# Patient Record
Sex: Male | Born: 1955
Health system: Southern US, Community
[De-identification: ages and names within clinical notes are randomized; demographics above are authoritative.]

## PROBLEM LIST (undated history)

## (undated) DIAGNOSIS — I10 Essential (primary) hypertension: Secondary | ICD-10-CM

---

## 2001-01-16 ENCOUNTER — Ambulatory Visit (HOSPITAL_COMMUNITY): Admission: RE | Admit: 2001-01-16 | Discharge: 2001-01-16 | Payer: Self-pay | Admitting: Family Medicine

## 2001-01-16 ENCOUNTER — Encounter: Payer: Self-pay | Admitting: Family Medicine

## 2001-02-12 ENCOUNTER — Emergency Department (HOSPITAL_COMMUNITY): Admission: EM | Admit: 2001-02-12 | Discharge: 2001-02-12 | Payer: Self-pay | Admitting: *Deleted

## 2001-02-12 ENCOUNTER — Encounter: Payer: Self-pay | Admitting: *Deleted

## 2002-02-08 ENCOUNTER — Emergency Department (HOSPITAL_COMMUNITY): Admission: EM | Admit: 2002-02-08 | Discharge: 2002-02-08 | Payer: Self-pay | Admitting: *Deleted

## 2005-01-27 ENCOUNTER — Emergency Department (HOSPITAL_COMMUNITY): Admission: EM | Admit: 2005-01-27 | Discharge: 2005-01-27 | Payer: Self-pay | Admitting: Emergency Medicine

## 2007-06-06 ENCOUNTER — Observation Stay (HOSPITAL_COMMUNITY): Admission: EM | Admit: 2007-06-06 | Discharge: 2007-06-06 | Payer: Self-pay | Admitting: Cardiology

## 2008-04-29 ENCOUNTER — Emergency Department (HOSPITAL_COMMUNITY): Admission: EM | Admit: 2008-04-29 | Discharge: 2008-04-29 | Payer: Self-pay | Admitting: Emergency Medicine

## 2009-09-26 ENCOUNTER — Emergency Department (HOSPITAL_COMMUNITY): Admission: EM | Admit: 2009-09-26 | Discharge: 2009-09-26 | Payer: Self-pay | Admitting: Emergency Medicine

## 2009-12-12 IMAGING — CT CT PELVIS W/ CM
2 of 5 series · 16 of 46 positions shown, 18 images · IV contrast (Omnipaque 300)
Comparison: No comparison CT.  Prior plain film examination
04/29/2008.

CT ABDOMEN

CLINICAL DATA: Right lower quadrant pain.

CT ABDOMEN AND PELVIS WITH CONTRAST
TECHNIQUE: Multidetector CT imaging of the abdomen and pelvis was
performed using the standard protocol following bolus
administration of intravenous contrast.
Contrast: 100 ml 2mnipaque-QRR.

[Series 2: abd_pel 5.0 b40f · axial · 0.76mm/px · z∈[-430,+10]mm · 13 of 100 slices shown, 15 images]
[im 6/100  soft-tissue]
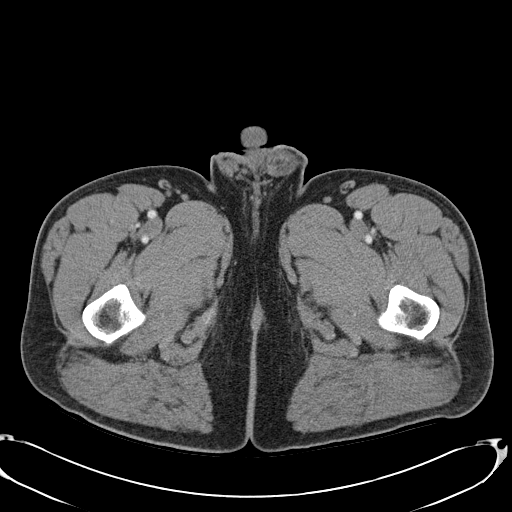
[im 6/100  bone]
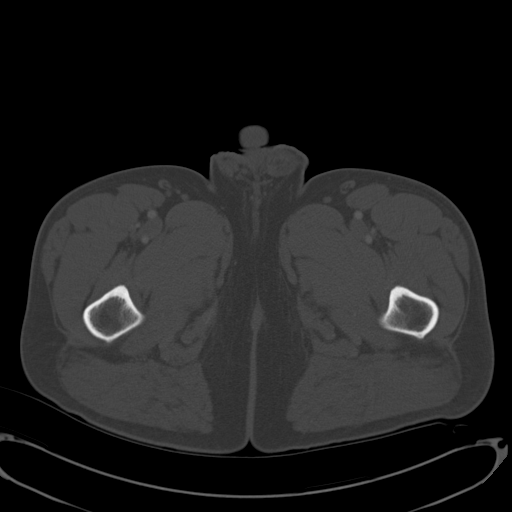
[im 12/100  soft-tissue]
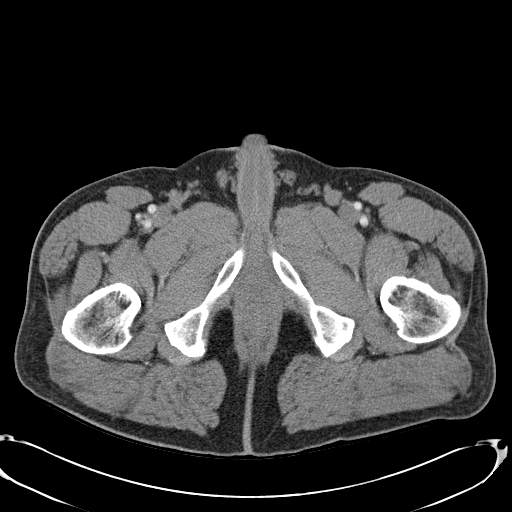
[im 24/100  soft-tissue]
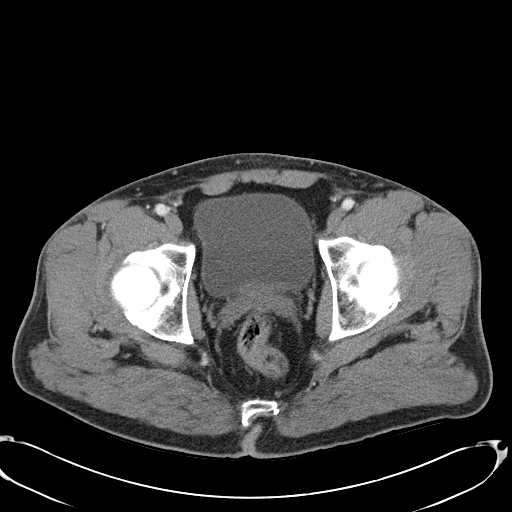
[im 30/100  soft-tissue]
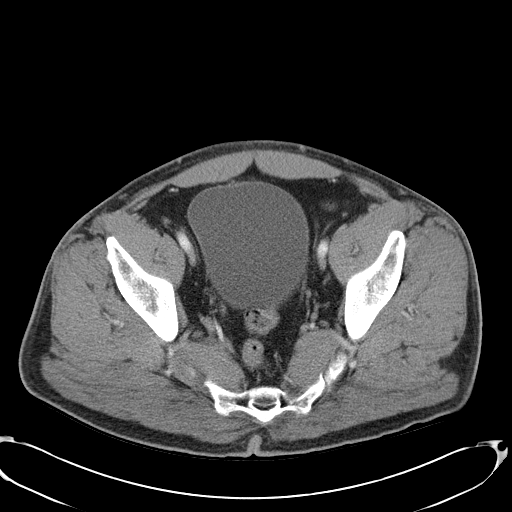
[im 35/100  soft-tissue]
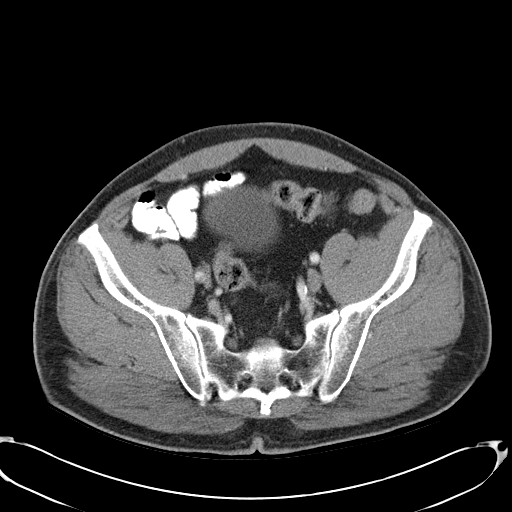
[im 41/100  soft-tissue]
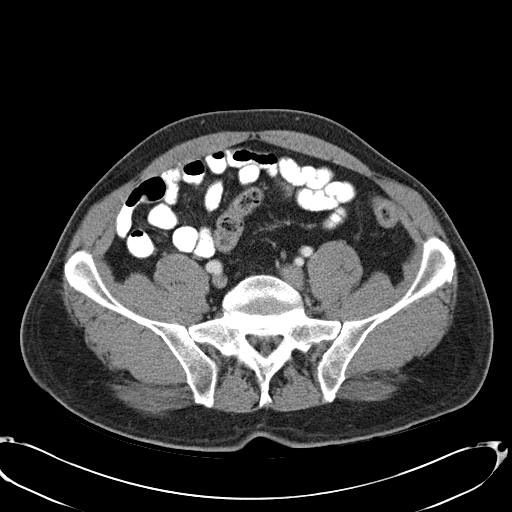
[im 53/100  soft-tissue]
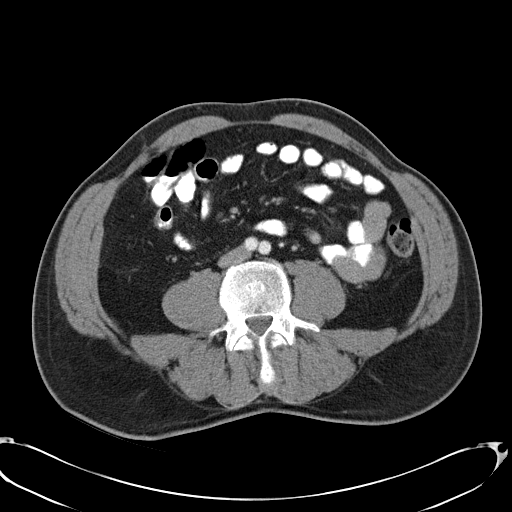
[im 59/100  soft-tissue]
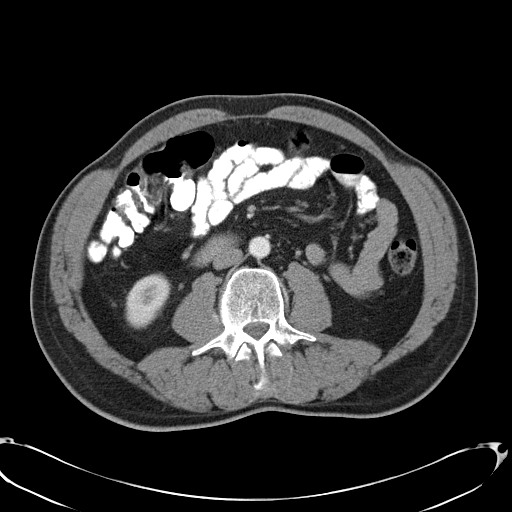
[im 65/100  soft-tissue]
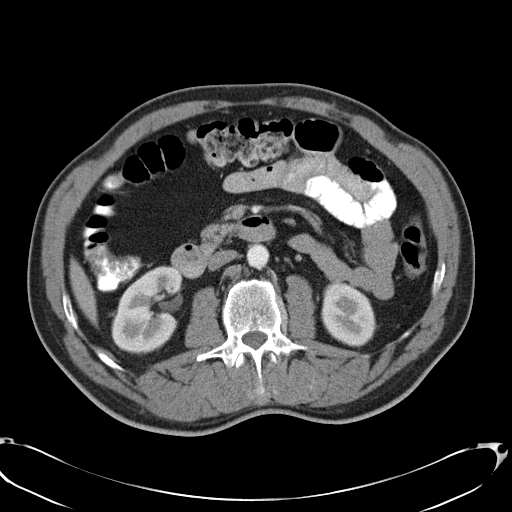
[im 65/100  bone]
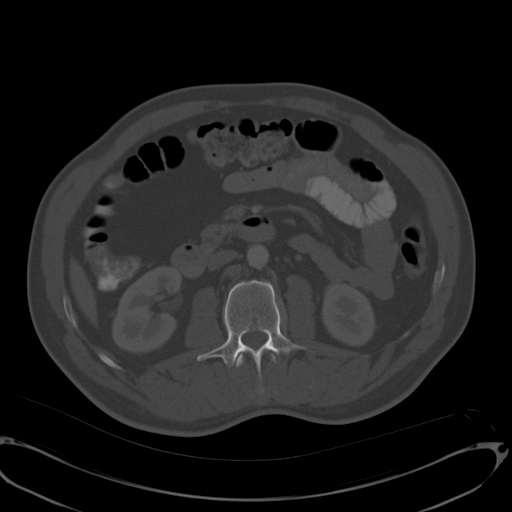
[im 70/100  soft-tissue]
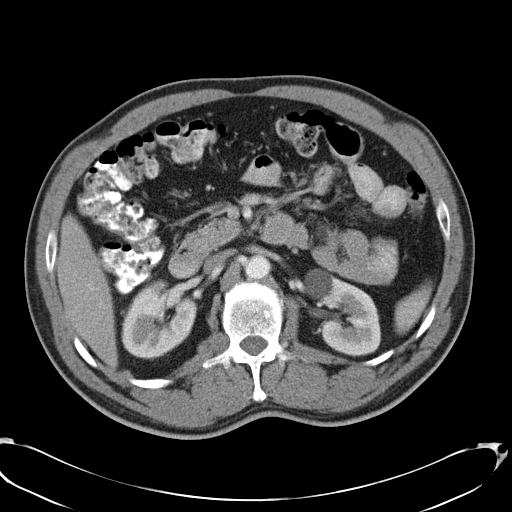
[im 76/100  soft-tissue]
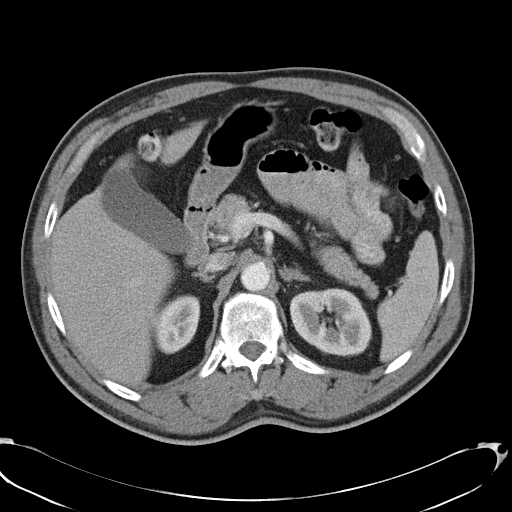
[im 88/100  soft-tissue]
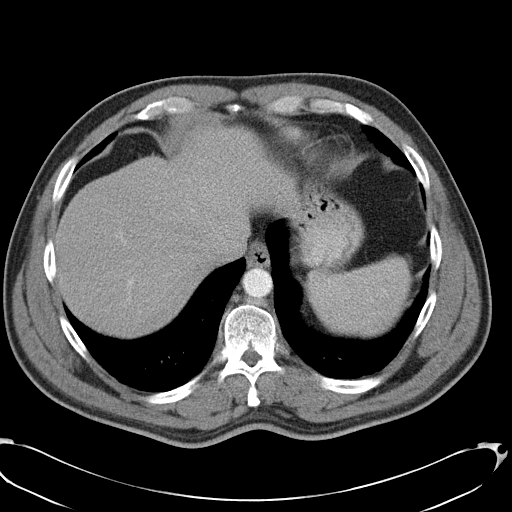
[im 94/100  soft-tissue]
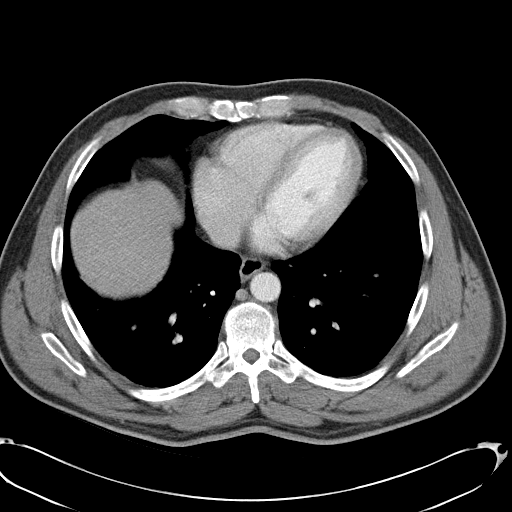

[Series 4: mpr coro post contrast · coronal · 0.73mm/px · 3 of 83 slices shown]
[im 28/83  soft-tissue]
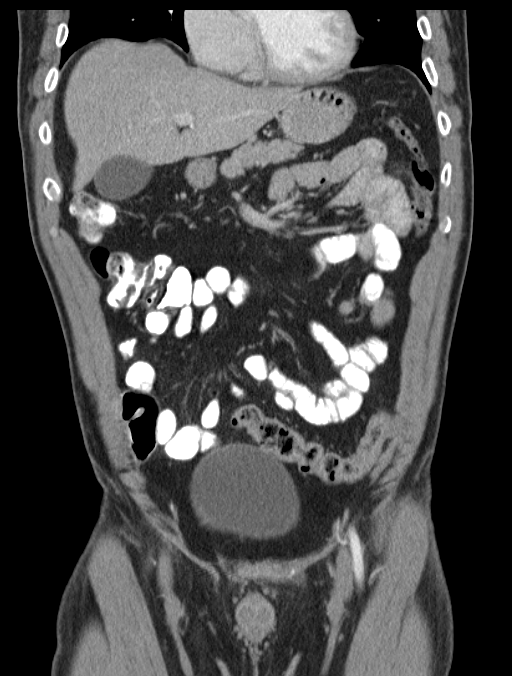
[im 37/83  soft-tissue]
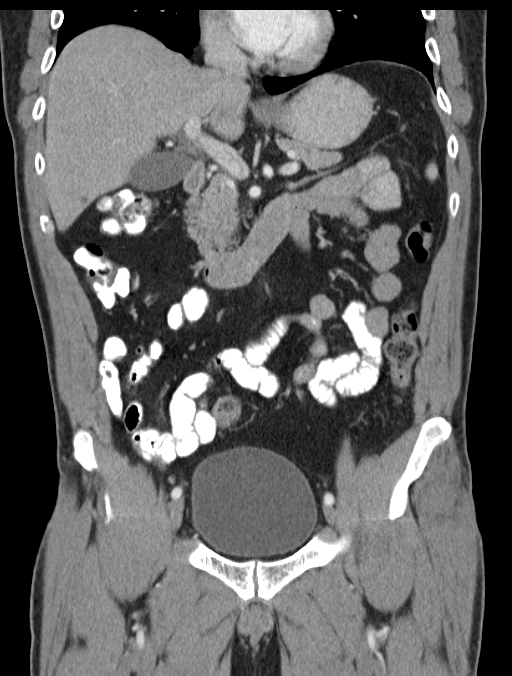
[im 46/83  soft-tissue]
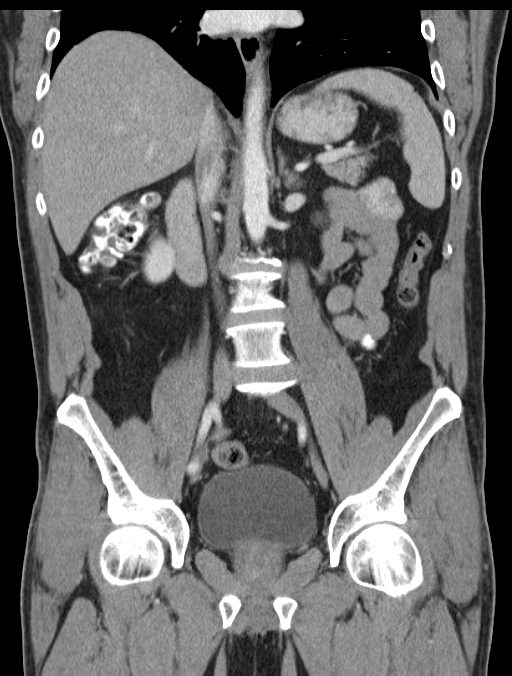

[16 of 46 positions shown; findings below may reference images not displayed]

FINDINGS: Scarring/subsegmental atelectatic changes lung bases.
Three sub centimeter low density lesions within the liver too small
to adequately characterize.  Suggestion of mild fatty infiltration
liver.  No calcified gallstone.  Left renal 2.4 cm cyst otherwise
no focal renal lesion.  Adrenal glands, pancreas and spleen
unremarkable.  Very mild calcification of the abdominal aorta
without aneurysmal dilation.  No upper abdominal abnormal fluid
collection or inflammatory process.  No retroperitoneal or pelvic
adenopathy.  Question small hiatal hernia. Degenerative changes
thoracic and lumbar spine. No bony destructive lesion.
IMPRESSION: No upper abdominal abnormal fluid collection inflammatory process.

Mild fatty infiltration liver with three sub centimeter low density
lesions too small to adequately characterize.

2.4 cm left renal cyst.

CT PELVIS
FINDINGS: No pelvic inflammatory process.  Specifically, no
inflammation surrounds the appendix. Generative changes lower
lumbar spine. No inguinal hernia.  Prostate gland top normal in
size.
IMPRESSION: No pelvic inflammatory process.

## 2010-06-26 LAB — BASIC METABOLIC PANEL
CO2: 29 mEq/L (ref 19–32)
Calcium: 9 mg/dL (ref 8.4–10.5)
Creatinine, Ser: 1.12 mg/dL (ref 0.4–1.5)
Glucose, Bld: 104 mg/dL — ABNORMAL HIGH (ref 70–99)

## 2010-06-26 LAB — URINALYSIS, ROUTINE W REFLEX MICROSCOPIC
Nitrite: NEGATIVE
Specific Gravity, Urine: 1.025 (ref 1.005–1.030)
pH: 6 (ref 5.0–8.0)

## 2010-06-26 LAB — CBC
Hemoglobin: 14.3 g/dL (ref 13.0–17.0)
MCHC: 34.6 g/dL (ref 30.0–36.0)
RDW: 12.7 % (ref 11.5–15.5)

## 2010-06-26 LAB — DIFFERENTIAL
Basophils Absolute: 0 10*3/uL (ref 0.0–0.1)
Basophils Relative: 0 % (ref 0–1)
Monocytes Absolute: 0.6 10*3/uL (ref 0.1–1.0)
Neutro Abs: 3.8 10*3/uL (ref 1.7–7.7)
Neutrophils Relative %: 58 % (ref 43–77)

## 2010-06-26 LAB — URINE CULTURE: Colony Count: NO GROWTH

## 2010-07-25 LAB — COMPREHENSIVE METABOLIC PANEL
ALT: 14 U/L (ref 0–53)
AST: 16 U/L (ref 0–37)
Albumin: 4 g/dL (ref 3.5–5.2)
CO2: 29 mEq/L (ref 19–32)
Calcium: 9.1 mg/dL (ref 8.4–10.5)
GFR calc Af Amer: >60 mL/min (ref >60)
Sodium: 137 mEq/L (ref 135–145)
Total Protein: 6.7 g/dL (ref 6.0–8.3)

## 2010-07-25 LAB — DIFFERENTIAL
Eosinophils Absolute: 0.1 10*3/uL (ref 0.0–0.7)
Eosinophils Relative: 2 % (ref 0–5)
Lymphocytes Relative: 30 % (ref 12–46)
Lymphs Abs: 1.8 10*3/uL (ref 0.7–4.0)
Monocytes Relative: 9 % (ref 3–12)

## 2010-07-25 LAB — CBC
MCHC: 33.8 g/dL (ref 30.0–36.0)
Platelets: 203 10*3/uL (ref 150–400)
RBC: 5.28 MIL/uL (ref 4.22–5.81)
RDW: 12.7 % (ref 11.5–15.5)

## 2010-07-25 LAB — URINALYSIS, ROUTINE W REFLEX MICROSCOPIC
Bilirubin Urine: NEGATIVE
Nitrite: NEGATIVE
Protein, ur: NEGATIVE mg/dL
Specific Gravity, Urine: 1.02 (ref 1.005–1.030)
Urobilinogen, UA: 0.2 mg/dL (ref 0.0–1.0)

## 2010-08-23 NOTE — Discharge Summary (Signed)
NAMECEDERICK, BROADNAX NO.:  [id number]   MEDICAL RECORD NO.:  [id number]           PATIENT TYPE:   LOCATION:                                 FACILITY:   PHYSICIAN:  Vonna Kotyk R. Jacinto Halim, MD       DATE OF BIRTH:  [birth year]   DATE OF ADMISSION:  06/06/2007  DATE OF DISCHARGE:  06/07/2007                               DISCHARGE SUMMARY   HISTORY:  Mr. Dupras is a 55 year old white male patient who was  transferred from [facility] with complaints of chest pain, it  started while he was at rest. He reported sharp, stabbing, substernal  chest pain, denied association with shortness of breath or diaphoresis.  He did have some mild nausea with it.  He denied any exertional chest  pain or any notable dyspnea on exertion.  He was given a GI cocktail and  sublingual nitroglycerin, and Dilaudid with relief of his pain.  He was  transferred to [facility].   PRIOR MEDICAL HISTORY INCLUDES:  Hypertension, tobacco use, premature  family history of coronary artery disease.  He smokes a half-pack per  day.  He did not know the name of his medications, but he had been out  of them for 1 day.   ALLERGIES:  NKDA.   HOSPITAL COURSE:  He was admitted.  He was put on Lovenox.  He was seen  by Dr. Yates Decamp, the following day, on June 06, 2007.  His CK/MBs  were negative x2.  His total cholesterol 188, triglycerides 100, HDL 36,  LDL was 132.  He had normal renal function.  His AST 15, ALT 17, BUN 13,  creatinine 1.0, glucose was 103.  Sodium 137, potassium was 3.6.  Hemoglobin 14.3, hematocrit 41.5, WBC 6.8, and platelets were 192.  It  was decided that he could be discharged home in stable condition for  outpatient evaluation.   DISCHARGE MEDICATIONS:  1. Prilosec 20 mg a day.  2. Pravachol 40 mg a day.  3. Aspirin 81 mg a day.  4. Metoprolol 25 mg 1/2 twice per day.   DISCHARGE INSTRUCTIONS:  He will have a treadmill Myoview and a 2-D echo  on June 12, 2007.   He will follow up with Dr. Domingo Sep in [place]  office on June 14, 2007.   DISCHARGE DIAGNOSIS:  1. Chest pain, atypical, CK/MB is negative.  2. Dyslipidemia with low HDL and high LDL.  3. Tobacco smoking.  4. 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  5. Hypertension.      Lezlie Octave, N.P.      Cristy Hilts. Jacinto Halim, MD  Electronically Signed    BB/MEDQ  D:  06/06/2007  T:  06/07/2007  Job:  [phone]

## 2010-12-30 LAB — COMPREHENSIVE METABOLIC PANEL
AST: 15
Albumin: 3.5
BUN: 13
Calcium: 8.7
Creatinine, Ser: 1
GFR calc Af Amer: >60
Total Bilirubin: 0.7
Total Protein: 5.8 — ABNORMAL LOW

## 2010-12-30 LAB — CARDIAC PANEL(CRET KIN+CKTOT+MB+TROPI)
Total CK: 121
Troponin I: 0.03

## 2010-12-30 LAB — CBC
HCT: 41.5
Hemoglobin: 15.9
MCHC: 34.4
MCHC: 35
MCV: 83.9
MCV: 85.1
Platelets: 192
RBC: 5.41
RDW: 12.9
WBC: 6.8
WBC: 8.8

## 2010-12-30 LAB — DIFFERENTIAL
Basophils Absolute: 0
Basophils Relative: 1
Eosinophils Absolute: 0.2
Eosinophils Relative: 2
Lymphocytes Relative: 39
Lymphs Abs: 2.7
Lymphs Abs: 3.6
Monocytes Absolute: 0.6
Monocytes Absolute: 0.8
Monocytes Relative: 10
Monocytes Relative: 9
Neutro Abs: 3.4
Neutro Abs: 4.2
Neutrophils Relative %: 48

## 2010-12-30 LAB — BASIC METABOLIC PANEL
CO2: 30
Calcium: 9.5
Chloride: 102
GFR calc Af Amer: >60
Sodium: 139

## 2010-12-30 LAB — TSH: TSH: 9.435 — ABNORMAL HIGH

## 2010-12-30 LAB — LIPID PANEL
HDL: 36 — ABNORMAL LOW
Total CHOL/HDL Ratio: 5.2

## 2010-12-30 LAB — MAGNESIUM: Magnesium: 2.1

## 2010-12-30 LAB — POCT CARDIAC MARKERS
Myoglobin, poc: 88.3
Operator id: 216221
Troponin i, poc: <0.05
Troponin i, poc: <0.05

## 2010-12-30 LAB — PROTIME-INR
INR: 1.1
Prothrombin Time: 14.4

## 2011-05-07 ENCOUNTER — Emergency Department (HOSPITAL_COMMUNITY)
Admission: EM | Admit: 2011-05-07 | Discharge: 2011-05-08 | Disposition: A | Discharge status: Home / Self Care | Payer: Self-pay | Attending: Emergency Medicine | Admitting: Emergency Medicine

## 2011-05-07 ENCOUNTER — Encounter (HOSPITAL_COMMUNITY): Payer: Self-pay | Admitting: Emergency Medicine

## 2011-05-07 DIAGNOSIS — R0789 Other chest pain: Secondary | ICD-10-CM

## 2011-05-07 DIAGNOSIS — T148XXA Other injury of unspecified body region, initial encounter: Secondary | ICD-10-CM

## 2011-05-07 DIAGNOSIS — I10 Essential (primary) hypertension: Secondary | ICD-10-CM | POA: Insufficient documentation

## 2011-05-07 DIAGNOSIS — M549 Dorsalgia, unspecified: Secondary | ICD-10-CM | POA: Insufficient documentation

## 2011-05-07 DIAGNOSIS — J40 Bronchitis, not specified as acute or chronic: Secondary | ICD-10-CM | POA: Insufficient documentation

## 2011-05-07 DIAGNOSIS — F17200 Nicotine dependence, unspecified, uncomplicated: Secondary | ICD-10-CM | POA: Insufficient documentation

## 2011-05-07 HISTORY — DX: Essential (primary) hypertension: I10

## 2011-05-07 LAB — CBC
Hemoglobin: 15.9 g/dL (ref 13.0–17.0)
MCH: 29.9 pg (ref 26.0–34.0)
RBC: 5.31 MIL/uL (ref 4.22–5.81)

## 2011-05-07 LAB — DIFFERENTIAL
Eosinophils Absolute: 0.1 10*3/uL (ref 0.0–0.7)
Lymphs Abs: 2.5 10*3/uL (ref 0.7–4.0)
Monocytes Relative: 10 % (ref 3–12)
Neutro Abs: 5.1 10*3/uL (ref 1.7–7.7)
Neutrophils Relative %: 59 % (ref 43–77)

## 2011-05-07 MED ORDER — SODIUM CHLORIDE 0.9 % IV SOLN
INTRAVENOUS | Status: DC
  Administered 2011-05-07: via INTRAVENOUS

## 2011-05-07 NOTE — ED Notes (Signed)
Patient complaining of upper left back pain. States it started 2 weeks ago in the right upper side of back then went away, and came back on upper left side of back. Denies injury.

## 2011-05-08 ENCOUNTER — Emergency Department (HOSPITAL_COMMUNITY): Payer: Self-pay

## 2011-05-08 LAB — BASIC METABOLIC PANEL
Chloride: 98 mEq/L (ref 96–112)
GFR calc non Af Amer: 70 mL/min — ABNORMAL LOW (ref >90)
Glucose, Bld: 102 mg/dL — ABNORMAL HIGH (ref 70–99)
Potassium: 3.5 mEq/L (ref 3.5–5.1)
Sodium: 134 mEq/L — ABNORMAL LOW (ref 135–145)

## 2011-05-08 LAB — TROPONIN I: Troponin I: <0.3 ng/mL (ref <0.30)

## 2011-05-08 MED ORDER — PROMETHAZINE-CODEINE 6.25-10 MG/5ML PO SYRP: 5.0000 mL | ORAL_SOLUTION | Freq: Four times a day (QID) | ORAL | Status: AC | PRN

## 2011-05-08 MED ORDER — METHOCARBAMOL 500 MG PO TABS: ORAL_TABLET | ORAL | Status: DC

## 2011-05-08 NOTE — ED Provider Notes (Signed)
History     CSN: [id number]  Arrival date & time 05/07/11  2059   First MD Initiated Contact with Patient 05/07/11 2300      Chief Complaint  Patient presents with  . Back Pain    (Consider location/radiation/quality/duration/timing/severity/associated sxs/prior treatment) HPI Comments: Pt states that 2 weeks ago he has pain in the right upper side of the back with cough. This went away and came back nearly a week later on the left back pain. Tonight he noted a sharp pain of the left upper back that has mostly gone away. No c/o SOB, no syncope, No sweats, No recent injury.  He denies hemoptosis or high fever. Risk factors include smoking and hypertension. No reported hx of cardiac problem. No long trips, or times of excessive sedimentary life style.  Patient is a 56 y.o. male presenting with back pain. The history is provided by the patient.  Back Pain  Pertinent negatives include no chest pain, no abdominal pain and no dysuria.    Past Medical History  Diagnosis Date  . Hypertension     History reviewed. No pertinent past surgical history.  History reviewed. No pertinent family history.  History  Substance Use Topics  . Smoking status: Current Some Day Smoker  . Smokeless tobacco: Not on file  . Alcohol Use: No      Review of Systems  Constitutional: Negative for activity change.       All ROS Neg except as noted in HPI  HENT: Positive for congestion and postnasal drip. Negative for nosebleeds and neck pain.   Eyes: Negative for photophobia and discharge.  Respiratory: Positive for cough. Negative for shortness of breath and wheezing.   Cardiovascular: Negative for chest pain and palpitations.  Gastrointestinal: Negative for abdominal pain and blood in stool.  Genitourinary: Negative for dysuria, frequency and hematuria.  Musculoskeletal: Positive for back pain. Negative for arthralgias.  Skin: Negative.   Neurological: Negative for dizziness, seizures and speech  difficulty.  Psychiatric/Behavioral: Negative for hallucinations and confusion.    Allergies  Review of patient's allergies indicates no known allergies.  Home Medications   Current Outpatient Rx  Name Route Sig Dispense Refill  . PRESCRIPTION MEDICATION Oral Take 1 tablet by mouth daily. Patient takes for blood pressure unsure of name and strength..      BP 133/63  Pulse 60  Temp(Src) 98 F (36.7 C) (Oral)  Resp 16  Ht 5\' 11"  (1.803 m)  Wt 190 lb (86.183 kg)  BMI 26.50 kg/m2  SpO2 95%  Physical Exam  Nursing note and vitals reviewed. Constitutional: He is oriented to person, place, and time. He appears well-developed and well-nourished.  Non-toxic appearance.  HENT:  Head: Normocephalic.  Right Ear: Tympanic membrane and external ear normal.  Left Ear: Tympanic membrane and external ear normal.  Eyes: EOM and lids are normal. Pupils are equal, round, and reactive to light.  Neck: Normal range of motion. Neck supple. Carotid bruit is not present.  Cardiovascular: Normal rate, regular rhythm, normal heart sounds, intact distal pulses and normal pulses.   Pulmonary/Chest: Breath sounds normal. No respiratory distress.       Course breath sounds.Minimal pain with ROM of the left shoulder and palpation under the left scapula area.  Abdominal: Soft. Bowel sounds are normal. There is no tenderness. There is no guarding.  Musculoskeletal: Normal range of motion.       Pain and tightness to the left upper back. Left upper back tenderness with attempted range  of motion of the left shoulder. No palpable rib deformity. No abscess appreciated.  Lymphadenopathy:       Head (right side): No submandibular adenopathy present.       Head (left side): No submandibular adenopathy present.    He has no cervical adenopathy.  Neurological: He is alert and oriented to person, place, and time. He has normal strength. No cranial nerve deficit or sensory deficit.  Skin: Skin is warm and dry.    Psychiatric: He has a normal mood and affect. His speech is normal.    ED Course  Procedures (including critical care time)  Labs Reviewed  BASIC METABOLIC PANEL - Abnormal; Notable for the following:    Sodium 134 (*)    Glucose, Bld 102 (*)    GFR calc non Af Amer 70 (*)    GFR calc Af Amer 82 (*)    All other components within normal limits  CBC  DIFFERENTIAL  D-DIMER, QUANTITATIVE  TROPONIN I   Dg Chest 2 View  05/08/2011  *RADIOLOGY REPORT*  Clinical Data: Right upper chest pain, cough, former smoker  CHEST - 2 VIEW  Comparison: 06/05/2007  Findings: Normal heart size, mediastinal contours, and pulmonary vascularity. Minimal atelectasis right base. Peribronchial thickening. No acute infiltrate, pleural effusion or pneumothorax. No acute osseous findings.  IMPRESSION: Bronchitic changes with right basilar atelectasis.  Original Report Authenticated By: Lollie Marrow, M.D.  EKG:  23:37 Rate 57. Rhythm: Sinus Huston Foley. Axis - normal. PR - Normal. QRS - Normal. ST - Normal. No STEMI or life threatening arrhythmia. Rate slower than 06/05/2007, o/w unchanged.   Dx: 1. Upper back pain (left) 2. Bronchitis   MDM  I have reviewed nursing notes, vital signs, and all appropriate lab and imaging results for this patient. This patient had pain on the right upper back approximately 2 weeks ago this went away and came back on the left upper side of the back. The patient noted a increased sharp pain today. He presents to the emergency department for evaluation of this particular problem. His vital signs are stable. Is on pulse oximetry is well within normal limits. His chest x-ray shows bronchitic changes but no acute process problem. His electrocardiogram shows a normal sinus rhythm without life-threatening arrhythmias. His cardiac enzymes, and D-dimer are negative for acute findings. It is safe for this patient to be discharged home. He will be treated for musculoskeletal upper left back and  shoulder he. Issue problem. Prescription for Robaxin 500 mg given to the patient. The patient also complains of cough and congestion and a prescription for promethazine cough medication was given.       Kathie Dike, [place] 05/09/11 [phone]

## 2011-05-09 NOTE — ED Provider Notes (Signed)
Medical screening examination/treatment/procedure(s) were performed by non-physician practitioner and as supervising physician I was immediately available for consultation/collaboration.  Nicoletta Dress. Colon Branch, MD 05/09/11 [phone]

## 2013-01-27 ENCOUNTER — Emergency Department (HOSPITAL_COMMUNITY)
Admission: EM | Admit: 2013-01-27 | Discharge: 2013-01-27 | Disposition: A | Discharge status: Home / Self Care | Payer: Self-pay | Attending: Emergency Medicine | Admitting: Emergency Medicine

## 2013-01-27 ENCOUNTER — Emergency Department (HOSPITAL_COMMUNITY): Payer: Self-pay

## 2013-01-27 ENCOUNTER — Encounter (HOSPITAL_COMMUNITY): Payer: Self-pay | Admitting: Emergency Medicine

## 2013-01-27 DIAGNOSIS — Y9289 Other specified places as the place of occurrence of the external cause: Secondary | ICD-10-CM | POA: Insufficient documentation

## 2013-01-27 DIAGNOSIS — F17200 Nicotine dependence, unspecified, uncomplicated: Secondary | ICD-10-CM | POA: Insufficient documentation

## 2013-01-27 DIAGNOSIS — Y9389 Activity, other specified: Secondary | ICD-10-CM | POA: Insufficient documentation

## 2013-01-27 DIAGNOSIS — I10 Essential (primary) hypertension: Secondary | ICD-10-CM | POA: Insufficient documentation

## 2013-01-27 DIAGNOSIS — W208XXA Other cause of strike by thrown, projected or falling object, initial encounter: Secondary | ICD-10-CM | POA: Insufficient documentation

## 2013-01-27 DIAGNOSIS — S90129A Contusion of unspecified lesser toe(s) without damage to nail, initial encounter: Secondary | ICD-10-CM | POA: Insufficient documentation

## 2013-01-27 DIAGNOSIS — S90111A Contusion of right great toe without damage to nail, initial encounter: Secondary | ICD-10-CM

## 2013-01-27 DIAGNOSIS — Z79899 Other long term (current) drug therapy: Secondary | ICD-10-CM | POA: Insufficient documentation

## 2013-01-27 MED ORDER — IBUPROFEN 800 MG PO TABS
800.0000 mg | ORAL_TABLET | Freq: Once | ORAL | Status: AC
  Administered 2013-01-27: 800 mg via ORAL
  Filled 2013-01-27: qty 1

## 2013-01-27 NOTE — ED Provider Notes (Signed)
CSN: [id number]     Arrival date & time 01/27/13  2133 History   First MD Initiated Contact with Patient 01/27/13 2228     Chief Complaint  Patient presents with  . Toe Injury   (Consider location/radiation/quality/duration/timing/severity/associated sxs/prior Treatment) Patient is a 57 y.o. male presenting with toe pain. The history is provided by the patient.  Toe Pain This is a new problem. The current episode started today. The problem has been gradually worsening.   Francisco Nash is a 57 y.o. male who presents to the ED with pain in the great toe of right foot. He dropped a heavy cinder block on his foot tonight. He has bruising and swelling of the right great toe. He denies any other injuries.  Past Medical History  Diagnosis Date  . Hypertension    History reviewed. No pertinent past surgical history. No family history on file. History  Substance Use Topics  . Smoking status: Current Some Day Smoker  . Smokeless tobacco: Not on file  . Alcohol Use: No    Review of Systems  Musculoskeletal:       Right great toe pain   10 systems reviewed and negative.   Allergies  Review of patient's allergies indicates no known allergies.  Home Medications   Current Outpatient Rx  Name  Route  Sig  Dispense  Refill  . methocarbamol (ROBAXIN) 500 MG tablet      2 po tid for spasm   30 tablet   0   . PRESCRIPTION MEDICATION   Oral   Take 1 tablet by mouth daily. Patient takes for blood pressure unsure of name and strength..          BP 152/109  Pulse 92  Temp(Src) 98.4 F (36.9 C) (Oral)  Resp 18  Ht 5\' 11"  (1.803 m)  Wt 190 lb (86.183 kg)  BMI 26.51 kg/m2  SpO2 96% Physical Exam  Nursing note and vitals reviewed. Constitutional: He is oriented to person, place, and time. He appears well-developed and well-nourished. No distress.  HENT:  Head: Normocephalic and atraumatic.  Eyes: EOM are normal.  Neck: Neck supple.  Cardiovascular: Normal rate.     Pulmonary/Chest: Effort normal.  Abdominal: Soft. There is no tenderness.  Musculoskeletal:       Right foot: He exhibits tenderness and swelling. He exhibits normal range of motion.       Feet:  Tenderness swelling and ecchymosis at base of right great toe. Nail without injury. Pedal pulse strong, adequate circulation, good touch sensation.  Neurological: He is alert and oriented to person, place, and time. No cranial nerve deficit.  Skin: Skin is warm and dry.  Psychiatric: He has a normal mood and affect. His behavior is normal.    ED Course  ProceduresDg Foot Complete Right  01/27/2013   CLINICAL DATA:  Pain, swelling, and bruising of the right 1st toe to after patient dropped a cement block on it.  EXAM: RIGHT FOOT COMPLETE - 3+ VIEW  COMPARISON:  None.  FINDINGS: There is no evidence of fracture or dislocation. There is no evidence of arthropathy or other focal bone abnormality. Soft tissues are unremarkable.  IMPRESSION: Negative.   Electronically Signed   By: Burman Nieves M.D.   On: 01/27/2013 22:02    MDM  57 y.o. male with contusion to the right foot. Buddy tape and offered patient post op shoe which he declined. Offered pain medication which he declined. States he will take ibuprofen, apply ice  and elevate. He will return for any problems. He remains neurovascularly intact.     [facility] Orlene Och, NP 01/28/13 0157

## 2013-01-27 NOTE — ED Notes (Signed)
Pt reports dropping a cinder block on right foot this evening. Large toe of right foot is swollen and bruised.

## 2013-01-27 NOTE — ED Notes (Signed)
Pt declines post op shoe, wears heavy work boots. Now wearing flipflops, instructed on buddy taping and given roll of tape, will buddy tape when he gets home. Given ice pack.

## 2013-01-28 NOTE — ED Provider Notes (Signed)
Medical screening examination/treatment/procedure(s) were performed by non-physician practitioner and as supervising physician I was immediately available for consultation/collaboration.  Geoffery Lyons, MD 01/28/13 1556

## 2014-09-05 ENCOUNTER — Encounter (HOSPITAL_COMMUNITY): Payer: Self-pay | Admitting: *Deleted

## 2014-09-05 ENCOUNTER — Emergency Department (HOSPITAL_COMMUNITY)
Admission: EM | Admit: 2014-09-05 | Discharge: 2014-09-05 | Discharge status: Against Medical Advice | Payer: Self-pay | Attending: Emergency Medicine | Admitting: Emergency Medicine

## 2014-09-05 DIAGNOSIS — R110 Nausea: Secondary | ICD-10-CM | POA: Insufficient documentation

## 2014-09-05 DIAGNOSIS — R55 Syncope and collapse: Secondary | ICD-10-CM | POA: Insufficient documentation

## 2014-09-05 DIAGNOSIS — I10 Essential (primary) hypertension: Secondary | ICD-10-CM | POA: Insufficient documentation

## 2014-09-05 DIAGNOSIS — Z720 Tobacco use: Secondary | ICD-10-CM | POA: Insufficient documentation

## 2014-09-05 NOTE — ED Notes (Signed)
Patient called for room and no answer x 1.

## 2014-09-05 NOTE — ED Notes (Signed)
Patient called for room and no answer x 2.

## 2014-09-05 NOTE — ED Notes (Signed)
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 LOC a "few seconds".  Was able to get up and work a little more, but was feeling nauseated. Had only eaten 3 PB crackers and a little water all day.  Family picked him up, and states he had affected speech. Continues to feel slightly nauseated.  Drank some Gatorade and ate few crackers.

## 2017-11-23 ENCOUNTER — Encounter (HOSPITAL_COMMUNITY): Payer: Self-pay | Admitting: Emergency Medicine

## 2017-11-23 ENCOUNTER — Emergency Department (HOSPITAL_COMMUNITY)
Admission: EM | Admit: 2017-11-23 | Discharge: 2017-11-23 | Disposition: A | Discharge status: Home / Self Care | Payer: Self-pay | Attending: Emergency Medicine | Admitting: Emergency Medicine

## 2017-11-23 ENCOUNTER — Other Ambulatory Visit: Payer: Self-pay

## 2017-11-23 DIAGNOSIS — T162XXA Foreign body in left ear, initial encounter: Secondary | ICD-10-CM | POA: Insufficient documentation

## 2017-11-23 DIAGNOSIS — Z79899 Other long term (current) drug therapy: Secondary | ICD-10-CM | POA: Insufficient documentation

## 2017-11-23 DIAGNOSIS — Y999 Unspecified external cause status: Secondary | ICD-10-CM | POA: Insufficient documentation

## 2017-11-23 DIAGNOSIS — X58XXXA Exposure to other specified factors, initial encounter: Secondary | ICD-10-CM | POA: Insufficient documentation

## 2017-11-23 DIAGNOSIS — Y929 Unspecified place or not applicable: Secondary | ICD-10-CM | POA: Insufficient documentation

## 2017-11-23 DIAGNOSIS — Y939 Activity, unspecified: Secondary | ICD-10-CM | POA: Insufficient documentation

## 2017-11-23 DIAGNOSIS — I10 Essential (primary) hypertension: Secondary | ICD-10-CM | POA: Insufficient documentation

## 2017-11-23 DIAGNOSIS — H7292 Unspecified perforation of tympanic membrane, left ear: Secondary | ICD-10-CM | POA: Insufficient documentation

## 2017-11-23 DIAGNOSIS — F17210 Nicotine dependence, cigarettes, uncomplicated: Secondary | ICD-10-CM | POA: Insufficient documentation

## 2017-11-23 NOTE — ED Notes (Signed)
ED Provider at bedside. 

## 2017-11-23 NOTE — ED Triage Notes (Signed)
Patient states he was outside when he felt a bug fly into his left ear. Patient states he put warm water and cooking oil in his ear per friends advice. Patient states he does not feel the bug moving around at this time.

## 2017-11-24 NOTE — ED Provider Notes (Signed)
  [facility] Provider Note   CSN: [id number] Arrival date & time: 11/23/17  2150     History   Chief Complaint Chief Complaint  Patient presents with  . Foreign Body in Ear    HPI Francisco Nash is a 62 y.o. male.  HPI Patient states he felt a bug fly into his left ear earlier today.  Under suggestion of a relative he poured water and hot oil into his ear.  States the bug stop moving but he still feels the insect in there.  Has difficulty hearing out of that ear now. Past Medical History:  Diagnosis Date  . Hypertension     There are no active problems to display for this patient.   History reviewed. No pertinent surgical history.      Home Medications    Prior to Admission medications   Medication Sig Start Date End Date Taking? Authorizing Provider  ibuprofen (ADVIL,MOTRIN) 200 MG tablet Take 200 mg by mouth every 6 (six) hours as needed for pain.    [provider]  olmesartan (BENICAR) 40 MG tablet Take 40 mg by mouth daily.    [provider]    Family History History reviewed. No pertinent family history.  Social History Social History   Tobacco Use  . Smoking status: Current Some Day Smoker    Types: Cigarettes  Substance Use Topics  . Alcohol use: No  . Drug use: No     Allergies   Patient has no known allergies.   Review of Systems Review of Systems  Constitutional: Negative for chills and fever.  HENT:       Left ear pain and difficulty hearing.  Respiratory: Negative for shortness of breath.   Cardiovascular: Negative for chest pain.     Physical Exam Updated Vital Signs BP 136/89 (BP Location: Left Arm)   Pulse 77   Temp 97.7 F (36.5 C) (Oral)   Resp 16   Ht 5\' 9"  (1.753 m)   Wt 86.2 kg   SpO2 98%   BMI 28.06 kg/m   Physical Exam  Constitutional: He appears well-developed.  HENT:  Head: Normocephalic.  Insect in left ear.  After removal insect has some bleeding and apparently  perforated TM.  Decreased hearing on that side.  Eyes: EOM are normal.  Neck: Neck supple.  Cardiovascular: Normal rate.     ED Treatments / Results  Labs (all labs ordered are listed, but only abnormal results are displayed) Labs Reviewed - No data to display  EKG None  Radiology No results found.  Procedures Procedures (including critical care time)  Medications Ordered in ED Medications - No data to display   Initial Impression / Assessment and Plan / ED Course  I have reviewed the triage vital signs and the nursing notes.  Pertinent labs & imaging results that were available during my care of the patient were reviewed by me and considered in my medical decision making (see chart for details).     Patient with foreign body in ear.  removed with irrigation and curette.  There was perforated TM behind the insect.  Mild bleeding.  Will have follow-up with ENT  Final Clinical Impressions(s) / ED Diagnoses   Final diagnoses:  Foreign body of left ear, initial encounter  Perforation of left tympanic membrane    ED Discharge Orders    None       Benjiman CorePickering, Sukhmani Fetherolf, MD 11/24/17 0013

## 2017-11-27 ENCOUNTER — Emergency Department (HOSPITAL_COMMUNITY)
Admission: EM | Admit: 2017-11-27 | Discharge: 2017-11-27 | Disposition: A | Discharge status: Home / Self Care | Payer: Self-pay | Attending: Emergency Medicine | Admitting: Emergency Medicine

## 2017-11-27 ENCOUNTER — Encounter (HOSPITAL_COMMUNITY): Payer: Self-pay

## 2017-11-27 ENCOUNTER — Other Ambulatory Visit: Payer: Self-pay

## 2017-11-27 DIAGNOSIS — R252 Cramp and spasm: Secondary | ICD-10-CM | POA: Insufficient documentation

## 2017-11-27 DIAGNOSIS — Z79899 Other long term (current) drug therapy: Secondary | ICD-10-CM | POA: Insufficient documentation

## 2017-11-27 DIAGNOSIS — I10 Essential (primary) hypertension: Secondary | ICD-10-CM | POA: Insufficient documentation

## 2017-11-27 DIAGNOSIS — F17210 Nicotine dependence, cigarettes, uncomplicated: Secondary | ICD-10-CM | POA: Insufficient documentation

## 2017-11-27 LAB — CBC WITH DIFFERENTIAL/PLATELET
BASOS ABS: 0 10*3/uL (ref 0.0–0.1)
BASOS PCT: 0 %
EOS PCT: 3 %
Eosinophils Absolute: 0.2 10*3/uL (ref 0.0–0.7)
HEMATOCRIT: 47 % (ref 39.0–52.0)
Hemoglobin: 16 g/dL (ref 13.0–17.0)
LYMPHS PCT: 27 %
Lymphs Abs: 1.8 10*3/uL (ref 0.7–4.0)
MCH: 29.9 pg (ref 26.0–34.0)
MCHC: 34 g/dL (ref 30.0–36.0)
MCV: 87.9 fL (ref 78.0–100.0)
MONO ABS: 0.6 10*3/uL (ref 0.1–1.0)
Monocytes Relative: 9 %
NEUTROS ABS: 4.1 10*3/uL (ref 1.7–7.7)
Neutrophils Relative %: 61 %
PLATELETS: 177 10*3/uL (ref 150–400)
RBC: 5.35 MIL/uL (ref 4.22–5.81)
RDW: 12.4 % (ref 11.5–15.5)
WBC: 6.8 10*3/uL (ref 4.0–10.5)

## 2017-11-27 LAB — BASIC METABOLIC PANEL
ANION GAP: 5 (ref 5–15)
BUN: 10 mg/dL (ref 8–23)
CO2: 28 mmol/L (ref 22–32)
Calcium: 8.8 mg/dL — ABNORMAL LOW (ref 8.9–10.3)
Chloride: 104 mmol/L (ref 98–111)
Creatinine, Ser: 1.08 mg/dL (ref 0.61–1.24)
GFR calc Af Amer: >60 mL/min (ref >60)
Glucose, Bld: 97 mg/dL (ref 70–99)
POTASSIUM: 4 mmol/L (ref 3.5–5.1)
SODIUM: 137 mmol/L (ref 135–145)

## 2017-11-27 MED ORDER — METHOCARBAMOL 500 MG PO TABS: 500.0000 mg | ORAL_TABLET | Freq: Three times a day (TID) | ORAL | 0 refills | Status: DC

## 2017-11-27 NOTE — ED Provider Notes (Signed)
[facility]   CSN: [id number] Arrival date & time: 11/27/17  1055     History   Chief Complaint Chief Complaint  Patient presents with  . Leg Pain    HPI Francisco Nash is a 62 y.o. male.  HPI  Francisco Nash is a 62 y.o. male who presents to the Emergency Department complaining of right leg cramps x 2 this morning.  States that he had two episodes of severe cramps to the right posterior calf and posterior thigh that occurred while at work.  He was bending over when the cramps began.  States the last episode lasted approximately 30 minutes before it resolved.  No pain at present.  He denies taking direutics, numbness or swelling of the extremity, injury, or vomiting.  Admits to working outside in the heat recently.  No chest pain, shortness of breath or cramping elsewhere.     Past Medical History:  Diagnosis Date  . Hypertension     There are no active problems to display for this patient.   History reviewed. No pertinent surgical history.    Home Medications    Prior to Admission medications   Medication Sig Start Date End Date Taking? Authorizing Provider  ibuprofen (ADVIL,MOTRIN) 200 MG tablet Take 200 mg by mouth every 6 (six) hours as needed for pain.    [provider]  methocarbamol (ROBAXIN) 500 MG tablet Take 1 tablet (500 mg total) by mouth 3 (three) times daily. 11/27/17   Keithon Mccoin, PA-C  olmesartan (BENICAR) 40 MG tablet Take 40 mg by mouth daily.    [provider]    Family History No family history on file.  Social History Social History   Tobacco Use  . Smoking status: Current Some Day Smoker    Packs/day: 0.50    Types: Cigarettes  . Smokeless tobacco: Never Used  Substance Use Topics  . Alcohol use: No  . Drug use: No     Allergies   Patient has no known allergies.   Review of Systems Review of Systems  Constitutional: Negative for chills and fever.  Respiratory: Negative  for chest tightness and shortness of breath.   Cardiovascular: Negative for chest pain.  Gastrointestinal: Negative for diarrhea and vomiting.  Musculoskeletal: Positive for myalgias (right leg pain and cramping). Negative for arthralgias, back pain, joint swelling and neck pain.  Skin: Negative for color change and wound.  Neurological: Negative for dizziness, syncope, weakness, light-headedness and numbness.     Physical Exam Updated Vital Signs BP 134/85 (BP Location: Right Arm)   Pulse 77   Temp 97.7 F (36.5 C) (Oral)   Resp 18   Ht 5\' 9"  (1.753 m)   Wt 81.6 kg   SpO2 97%   BMI 26.58 kg/m   Physical Exam  Constitutional: He is oriented to person, place, and time. He appears well-developed and well-nourished. No distress.  HENT:  Mouth/Throat: Oropharynx is clear and moist.  Neck: Normal range of motion.  Cardiovascular: Normal rate, regular rhythm and intact distal pulses.  Pulmonary/Chest: Effort normal and breath sounds normal. No respiratory distress.  Musculoskeletal: Normal range of motion. He exhibits no edema or tenderness.  Right LE is non-tender, no edema or erythema.  Pt has full ROM of the right knee and hip.    Neurological: He is alert and oriented to person, place, and time. No sensory deficit.  Skin: Skin is warm. Capillary refill takes less than 2 seconds.  Psychiatric:  He has a normal mood and affect.  Nursing note and vitals reviewed.    ED Treatments / Results  Labs (all labs ordered are listed, but only abnormal results are displayed) Labs Reviewed  BASIC METABOLIC PANEL - Abnormal; Notable for the following components:      Result Value   Calcium 8.8 (*)    All other components within normal limits  CBC WITH DIFFERENTIAL/PLATELET    EKG None  Radiology No results found.  Procedures Procedures (including critical care time)  Medications Ordered in ED Medications - No data to display   Initial Impression / Assessment and Plan / ED  Course  I have reviewed the triage vital signs and the nursing notes.  Pertinent labs & imaging results that were available during my care of the patient were reviewed by me and considered in my medical decision making (see chart for details).     Pt with intermittent episodes of muscle cramping to the right LLE.  Asymptomatic at present.  Vitals reviewed.  No diarrhea or use of diuretics.  No clinical signs of dehydration.  Pt agrees to tx plan and appears appropriate for d/c home.   Final Clinical Impressions(s) / ED Diagnoses   Final diagnoses:  Muscle cramps    ED Discharge Orders         Ordered    methocarbamol (ROBAXIN) 500 MG tablet  3 times daily     11/27/17 1225           Pauline Ausriplett, Randee Upchurch, PA-C 11/27/17 1649    Sabas SousBero, Michael M, MD 11/30/17 [phone]

## 2017-11-27 NOTE — ED Triage Notes (Signed)
Pt is having right leg cramping. States it happened twice this morning and each episode lasted 30 mins. Is ambulatory

## 2017-11-27 NOTE — Discharge Instructions (Addendum)
Alternate ice and heat to your leg.  Avoid strenuous activity for 1 to 2 days.  Drink plenty of fluids for the next several days.  Follow-up with your primary doctor for recheck or return to the ER for any worsening symptoms.

## 2020-09-27 ENCOUNTER — Emergency Department (HOSPITAL_COMMUNITY)
Admission: EM | Admit: 2020-09-27 | Discharge: 2020-09-27 | Disposition: A | Discharge status: Home / Self Care | Payer: Medicare Other | Attending: Emergency Medicine | Admitting: Emergency Medicine

## 2020-09-27 ENCOUNTER — Encounter (HOSPITAL_COMMUNITY): Payer: Self-pay

## 2020-09-27 ENCOUNTER — Emergency Department (HOSPITAL_COMMUNITY): Payer: Medicare Other

## 2020-09-27 ENCOUNTER — Other Ambulatory Visit: Payer: Self-pay

## 2020-09-27 DIAGNOSIS — F17210 Nicotine dependence, cigarettes, uncomplicated: Secondary | ICD-10-CM | POA: Diagnosis not present

## 2020-09-27 DIAGNOSIS — R079 Chest pain, unspecified: Secondary | ICD-10-CM | POA: Diagnosis present

## 2020-09-27 DIAGNOSIS — I10 Essential (primary) hypertension: Secondary | ICD-10-CM | POA: Insufficient documentation

## 2020-09-27 DIAGNOSIS — Z79899 Other long term (current) drug therapy: Secondary | ICD-10-CM | POA: Diagnosis not present

## 2020-09-27 LAB — BASIC METABOLIC PANEL
Anion gap: 5 (ref 5–15)
BUN: 9 mg/dL (ref 8–23)
CO2: 29 mmol/L (ref 22–32)
Calcium: 8.9 mg/dL (ref 8.9–10.3)
Chloride: 102 mmol/L (ref 98–111)
Creatinine, Ser: 0.86 mg/dL (ref 0.61–1.24)
GFR, Estimated: >60 mL/min (ref >60)
Glucose, Bld: 91 mg/dL (ref 70–99)
Potassium: 4.1 mmol/L (ref 3.5–5.1)
Sodium: 136 mmol/L (ref 135–145)

## 2020-09-27 LAB — TROPONIN I (HIGH SENSITIVITY)
Troponin I (High Sensitivity): 3 ng/L (ref <18)
Troponin I (High Sensitivity): 3 ng/L (ref <18)

## 2020-09-27 LAB — CBC
HCT: 48.5 % (ref 39.0–52.0)
Hemoglobin: 16.1 g/dL (ref 13.0–17.0)
MCH: 29.6 pg (ref 26.0–34.0)
MCHC: 33.2 g/dL (ref 30.0–36.0)
MCV: 89.2 fL (ref 80.0–100.0)
Platelets: 170 10*3/uL (ref 150–400)
RBC: 5.44 MIL/uL (ref 4.22–5.81)
RDW: 12.5 % (ref 11.5–15.5)
WBC: 6.3 10*3/uL (ref 4.0–10.5)
nRBC: 0 % (ref 0.0–0.2)

## 2020-09-27 MED ORDER — ASPIRIN 81 MG PO CHEW
324.0000 mg | CHEWABLE_TABLET | Freq: Once | ORAL | Status: AC
Start: 2020-09-27 — End: 2020-09-27
  Administered 2020-09-27: 324 mg via ORAL
  Filled 2020-09-27: qty 4

## 2020-09-27 NOTE — ED Triage Notes (Signed)
Patient reports intermittent chest pain that started this am around 0700. States that he was sweating at time of pain.

## 2020-09-27 NOTE — ED Provider Notes (Signed)
[facility] EMERGENCY DEPARTMENT Provider Note   CSN: [id number] Arrival date & time: 09/27/20  9518     History Chief Complaint  Patient presents with   Chest Pain    Francisco Nash is a 65 y.o. male.   Chest Pain  This patient is a 65 year old male presenting with a complaint of left-sided sharp chest pain, this started within the last 2 hours it is intermittent, sharp, left-sided, comes on for a couple of seconds and then goes away, it has occurred 3 or 4 times this morning and was associated with significant sweating at 1 point.  He is not short of breath, he is not nauseated, he is currently chest pain-free.  He reports that he is able to exercise, he walks several times a week and states he never has any exertional chest pain.  The patient denies any recent trauma, travel, surgery, immobilization or any other increasing risk for pulmonary embolism.  He does smoke cigarettes.  Name of his blood pressure medication is olmesartan.  He has had a stress test in the past which she states was unremarkable at a time when he was having chest pain but states the chest pain felt differently than it does today.  I have reviewed the medical record and find no prior stress test or heart catheterizations or echocardiograms within the confines of this electronic medical record.  Past Medical History:  Diagnosis Date   Hypertension     There are no problems to display for this patient.   History reviewed. No pertinent surgical history.     History reviewed. No pertinent family history.  Social History   Tobacco Use   Smoking status: Some Days    Packs/day: 0.50    Pack years: 0.00    Types: Cigarettes   Smokeless tobacco: Never  Substance Use Topics   Alcohol use: No   Drug use: No    Home Medications Prior to Admission medications   Medication Sig Start Date End Date Taking? Authorizing Provider  olmesartan (BENICAR) 40 MG tablet Take 40 mg by mouth daily.   Yes [provider]    Allergies    Patient has no known allergies.  Review of Systems   Review of Systems  Cardiovascular:  Positive for chest pain.  All other systems reviewed and are negative.  Physical Exam Updated Vital Signs BP (!) 147/96   Pulse (!) 46   Temp 98 F (36.7 C) (Oral)   Resp 17   Ht 1.753 m (5\' 9" )   Wt 73.5 kg   SpO2 100%   BMI 23.92 kg/m   Physical Exam Vitals and nursing note reviewed.  Constitutional:      General: He is not in acute distress.    Appearance: He is well-developed. He is not ill-appearing or diaphoretic.  HENT:     Head: Normocephalic and atraumatic.     Mouth/Throat:     Pharynx: No oropharyngeal exudate.  Eyes:     General: No scleral icterus.       Right eye: No discharge.        Left eye: No discharge.     Conjunctiva/sclera: Conjunctivae normal.     Pupils: Pupils are equal, round, and reactive to light.  Neck:     Thyroid: No thyromegaly.     Vascular: No JVD.  Cardiovascular:     Rate and Rhythm: Normal rate and regular rhythm.     Heart sounds: Normal heart sounds. No murmur heard.  No friction rub. No gallop.  Pulmonary:     Effort: Pulmonary effort is normal. No respiratory distress.     Breath sounds: Normal breath sounds. No wheezing or rales.  Abdominal:     General: Bowel sounds are normal. There is no distension.     Palpations: Abdomen is soft. There is no mass.     Tenderness: There is no abdominal tenderness.  Musculoskeletal:        General: No tenderness. Normal range of motion.     Cervical back: Normal range of motion and neck supple.     Right lower leg: No edema.     Left lower leg: No edema.  Lymphadenopathy:     Cervical: No cervical adenopathy.  Skin:    General: Skin is warm and dry.     Findings: No erythema or rash.  Neurological:     Mental Status: He is alert.     Coordination: Coordination normal.  Psychiatric:        Behavior: Behavior normal.    ED Results / Procedures /  Treatments   Labs (all labs ordered are listed, but only abnormal results are displayed) Labs Reviewed  BASIC METABOLIC PANEL  CBC  TROPONIN I (HIGH SENSITIVITY)  TROPONIN I (HIGH SENSITIVITY)    EKG None  Radiology DG Chest 2 View  Result Date: 09/27/2020 CLINICAL DATA:  Chest pain EXAM: CHEST - 2 VIEW COMPARISON:  05/07/2011 chest radiograph. FINDINGS: Stable cardiomediastinal silhouette with normal heart size. No pneumothorax. No pleural effusion. Lungs appear clear, with no acute consolidative airspace disease and no pulmonary edema. IMPRESSION: No active cardiopulmonary disease. Electronically Signed   By: Delbert Phenix M.D.   On: 09/27/2020 12:10    Procedures Procedures   Medications Ordered in ED Medications  aspirin chewable tablet 324 mg (324 mg Oral Given 09/27/20 0945)    ED Course  I have reviewed the triage vital signs and the nursing notes.  Pertinent labs & imaging results that were available during my care of the patient were reviewed by me and considered in my medical decision making (see chart for details).  Clinical Course as of 09/28/20 1047  Mon Sep 27, 2020  1254 Chest pain is negative at this time, troponin is negative, atypical pain, stable for discharge [BM]    Clinical Course User Index [BM] Eber Hong, MD   MDM Rules/Calculators/A&P                          EKG is unremarkable, no ST elevation or depression, no abnormal T waves, patient's story is not necessarily consistent with classic angina and he is currently chest pain-free.  He will get aspirin, chest x-ray and lab work, will likely need a troponin with a delta troponin.  The patient is agreeable to the plan  EKG performed on September 27, 2020 at 8:59 AM shows sinus rhythm with mild sinus arrhythmia, heart rate of 63, no ST elevation, no depression, normal-appearing EKG  The patient has had unremarkable work-up, at this time he is stable for discharge and aware of the indications for  return  Final Clinical Impression(s) / ED Diagnoses Final diagnoses:  Nonspecific chest pain    Rx / DC Orders ED Discharge Orders     None        Eber Hong, MD 09/28/20 1047

## 2020-09-27 NOTE — Discharge Instructions (Addendum)
  Thank you for letting us take care of you today!  Please obtain all of your results from medical records or have your doctors office obtain the results - share them with your doctor - you should be seen at your doctors office in the next 2 days. Call today to arrange your follow up. Take the medications as prescribed. Please review all of the medicines and only take them if you do not have an allergy to them. Please be aware that if you are taking birth control pills, taking other prescriptions, ESPECIALLY ANTIBIOTICS may make the birth control ineffective - if this is the case, either do not engage in sexual activity or use alternative methods of birth control such as condoms until you have finished the medicine and your family doctor says it is OK to restart them. If you are on a blood thinner such as COUMADIN, be aware that any other medicine that you take may cause the coumadin to either work too much, or not enough - you should have your coumadin level rechecked in next 7 days if this is the case.  ?  It is also a possibility that you have an allergic reaction to any of the medicines that you have been prescribed - Everybody reacts differently to medications and while MOST people have no trouble with most medicines, you may have a reaction such as nausea, vomiting, rash, swelling, shortness of breath. If this is the case, please stop taking the medicine immediately and contact your physician.   If you were given a medication in the ED such as percocet, vicodin, or morphine, be aware that these medicines are sedating and may change your ability to take care of yourself adequately for several hours after being given this medicines - you should not drive or take care of small children if you were given this medicine in the Emergency Department or if you have been prescribed these types of medicines. ?   You should return to the ER IMMEDIATELY if you develop severe or worsening symptoms.   Your testing  shows no signs of abnormal heart function - xray is normal  ER for worsening symptoms  Please follow-up with a cardiologist in the next week for a recheck if your pain continues but come to the ER for worsening symptoms

## 2020-09-30 ENCOUNTER — Ambulatory Visit (INDEPENDENT_AMBULATORY_CARE_PROVIDER_SITE_OTHER): Payer: Medicare Other | Admitting: Cardiology

## 2020-09-30 ENCOUNTER — Encounter: Payer: Self-pay | Admitting: Cardiology

## 2020-09-30 ENCOUNTER — Other Ambulatory Visit: Payer: Self-pay

## 2020-09-30 VITALS — BP 140/81 | HR 52 | Ht 69.0 in | Wt 164.8 lb

## 2020-09-30 DIAGNOSIS — I10 Essential (primary) hypertension: Secondary | ICD-10-CM | POA: Diagnosis not present

## 2020-09-30 DIAGNOSIS — R079 Chest pain, unspecified: Secondary | ICD-10-CM

## 2020-09-30 DIAGNOSIS — Z13220 Encounter for screening for lipoid disorders: Secondary | ICD-10-CM

## 2020-09-30 LAB — LIPID PANEL
Chol/HDL Ratio: 3.9 ratio (ref 0.0–5.0)
Cholesterol, Total: 207 mg/dL — ABNORMAL HIGH (ref 100–199)
HDL: 53 mg/dL (ref >39)
LDL Chol Calc (NIH): 140 mg/dL — ABNORMAL HIGH (ref 0–99)
Triglycerides: 79 mg/dL (ref 0–149)
VLDL Cholesterol Cal: 14 mg/dL (ref 5–40)

## 2020-09-30 MED ORDER — BLOOD PRESSURE CUFF MISC: 1.0000 | Freq: Once | 0 refills | Status: DC

## 2020-09-30 MED ORDER — BLOOD PRESSURE CUFF MISC: 1.0000 | Freq: Once | 0 refills | Status: AC

## 2020-09-30 NOTE — Patient Instructions (Addendum)
Medication Instructions:  Your physician recommends that you continue on your current medications as directed. Please refer to the Current Medication list given to you today.  *If you need a refill on your cardiac medications before your next appointment, please call your pharmacy*   Lab Work: Lipid panel today  If you have labs (blood work) drawn today and your tests are completely normal, you will receive your results only by: MyChart Message (if you have MyChart) OR A paper copy in the mail If you have any lab test that is abnormal or we need to change your treatment, we will call you to review the results.   Testing/Procedures: Coronary CTA-see instructions below  Your physician has requested that you have an echocardiogram. Echocardiography is a painless test that uses sound waves to create images of your heart. It provides your doctor with information about the size and shape of your heart and how well your heart's chambers and valves are working. This procedure takes approximately one hour. There are no restrictions for this procedure.  Follow-Up: At [facility], you and your health needs are our priority.  As part of our continuing mission to provide you with exceptional heart care, we have created designated Provider Care Teams.  These Care Teams include your primary Cardiologist (physician) and Advanced Practice Providers (APPs -  Physician Assistants and Nurse Practitioners) who all work together to provide you with the care you need, when you need it.  We recommend signing up for the patient portal called "MyChart".  Sign up information is provided on this After Visit Summary.  MyChart is used to connect with patients for Virtual Visits (Telemedicine).  Patients are able to view lab/test results, encounter notes, upcoming appointments, etc.  Non-urgent messages can be sent to your provider as well.   To learn more about what you can do with MyChart, go to  [email or web address].    Your next appointment:   3-4 month(s)  The format for your next appointment:   In Person  Provider:   Dr. Bjorn Pippin   Other Instructions Recommend obtaining a [employer] Upper arm blood pressure cuff   Please check your blood pressure at home 2 times daily, write it down.  Call the office or send message via Mychart with the readings in 1 week for Dr. Bjorn Pippin to review.      Coronary CTA instructions:  Your cardiac CT will be scheduled at one of the below locations:   [facility] [address] [place], [place] [address] [phone]  If scheduled at [facility], please arrive at the [facility] main entrance (entrance A) of [facility] 30 minutes prior to test start time. Proceed to the [facility] Radiology Department (first floor) to check-in and test prep.  Please follow these instructions carefully (unless otherwise directed):  Hold all erectile dysfunction medications at least 3 days (72 hrs) prior to test.  On the Night Before the Test: Be sure to Drink plenty of water. Do not consume any caffeinated/decaffeinated beverages or chocolate 12 hours prior to your test. Do not take any antihistamines 12 hours prior to your test.  On the Day of the Test: Drink plenty of water until 1 hour prior to the test. Do not eat any food 4 hours prior to the test. You may take your regular medications prior to the test.       After the Test: Drink plenty of water. After receiving IV contrast, you may experience a mild  flushed feeling. This is normal. On occasion, you may experience a mild rash up to 24 hours after the test. This is not dangerous. If this occurs, you can take Benadryl 25 mg and increase your fluid intake. If you experience trouble breathing, this can be serious. If it is severe call 911 IMMEDIATELY. If it is mild, please call our office. If you take any of these medications: Glipizide/Metformin, Avandament,  Glucavance, please do not take 48 hours after completing test unless otherwise instructed.   Once we have confirmed authorization from your insurance company, we will call you to set up a date and time for your test. Based on how quickly your insurance processes prior authorizations requests, please allow up to 4 weeks to be contacted for scheduling your Cardiac CT appointment. Be advised that routine Cardiac CT appointments could be scheduled as many as 8 weeks after your provider has ordered it.  For non-scheduling related questions, please contact the cardiac imaging nurse navigator should you have any questions/concerns: Rockwell Alexandria, Cardiac Imaging Nurse Navigator Larey Brick, Cardiac Imaging Nurse Navigator [facility] Direct Office Dial: [phone]   For scheduling needs, including cancellations and rescheduling, please call [place], [phone].

## 2020-09-30 NOTE — Progress Notes (Signed)
Cardiology Office Note:    Date:  09/30/2020   ID:  Francisco Nash, DOB [birth year], MRN [id number]  PCP:  Sharilyn Sites, MD  Cardiologist:  None  Electrophysiologist:  None   Referring MD: Sharilyn Sites, MD   Chief Complaint  Patient presents with   Chest Pain    History of Present Illness:    Francisco Nash is a 65 y.o. male with a hx of hypertension who presents for ED follow-up for chest pain.  He was seen in the ED on 09/27/2020 with chest pain.  Troponins negative x2.  EKG without ischemic changes.  He reports that he has been having chest pain 2-3 times per week.  Describes as sharp stabbing pain on the left side of his chest.  Typically lasts for seconds and resolves.  No clear relationship with exertion.  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  Reports no relationship between chest pain and eating.  The day he went to the ED reports he was drinking coffee and noticed the pain on the left side of his chest.  Reports it resolved after seconds but then kept recurring every few minutes.  This happened 3-4 times, prompting him to go to the ED.  He denies any dyspnea, lightheadedness, syncope, lower extremity edema, palpitations, or leg pain with walking.  He has smoked for over 50 years up to 1 pack/day, but is currently smoking 0.25 pack/day.  Family history includes brother had CABG in 51s and he thinks another brother has CHF but he is unsure of the details.   BP Readings from Last 3 Encounters:  09/30/20 140/81  09/27/20 (!) 147/96  11/27/17 134/85     Past Medical History:  Diagnosis Date   Hypertension     No past surgical history on file.  Current Medications: Current Meds  Medication Sig   olmesartan (BENICAR) 40 MG tablet Take 40 mg by mouth daily.   [DISCONTINUED] Blood Pressure Monitoring (BLOOD PRESSURE CUFF) MISC 1 kit by Does not apply route once for 1 dose.     Allergies:   Patient has no known allergies.   Social History    Socioeconomic History   Marital status: Widowed    Spouse name: Not on file   Number of children: Not on file   Years of education: Not on file   Highest education level: Not on file  Occupational History   Not on file  Tobacco Use   Smoking status: Some Days    Packs/day: 0.50    Pack years: 0.00    Types: Cigarettes   Smokeless tobacco: Never  Substance and Sexual Activity   Alcohol use: No   Drug use: No   Sexual activity: Not on file  Other Topics Concern   Not on file  Social History Narrative   Not on file   Social Determinants of Health   Financial Resource Strain: Not on file  Food Insecurity: Not on file  Transportation Needs: Not on file  Physical Activity: Not on file  Stress: Not on file  Social Connections: Not on file     Family History: Family history includes brother had CABG in 62s and he thinks another brother has CHF but he is unsure of the details.  ROS:   Please see the history of present illness.     All other systems reviewed and are negative.  EKGs/Labs/Other Studies Reviewed:    The following studies were reviewed today:  EKG:  EKG is ordered today.  The ekg ordered today demonstrates sinus bradycardia, rate 53, no ST abnormalities  Recent Labs: 09/27/2020: BUN 9; Creatinine, Ser 0.86; Hemoglobin 16.1; Platelets 170; Potassium 4.1; Sodium 136  Recent Lipid Panel    Component Value Date/Time   CHOL  06/06/2007 0200    188        ATP III CLASSIFICATION:  <200     mg/dL   Desirable  200-239  mg/dL   Borderline High  >=240    mg/dL   High   TRIG 100 06/06/2007 0200   HDL 36 (L) 06/06/2007 0200   CHOLHDL 5.2 06/06/2007 0200   VLDL 20 06/06/2007 0200   LDLCALC (H) 06/06/2007 0200    132        Total Cholesterol/HDL:CHD Risk Coronary Heart Disease Risk Table                     Men   Women  1/2 Average Risk   3.4   3.3    Physical Exam:    VS:  BP 140/81   Pulse (!) 52   Ht $R'5\' 9"'DU$  (1.753 m)   Wt 164 lb 12.8 oz (74.8 kg)    SpO2 99%   BMI 24.34 kg/m     Wt Readings from Last 3 Encounters:  09/30/20 164 lb 12.8 oz (74.8 kg)  09/27/20 162 lb (73.5 kg)  11/27/17 180 lb (81.6 kg)     GEN:  Well nourished, well developed in no acute distress HEENT: Normal NECK: No JVD; No carotid bruits LYMPHATICS: No lymphadenopathy CARDIAC: distant heart sounds, RRR, no murmurs RESPIRATORY:  Clear to auscultation without rales, wheezing or rhonchi  ABDOMEN: Soft, non-tender, non-distended MUSCULOSKELETAL:  No edema; No deformity  SKIN: Warm and dry NEUROLOGIC:  Alert and oriented x 3 PSYCHIATRIC:  Normal affect   ASSESSMENT:    1. Chest pain of uncertain etiology   2. Essential hypertension   3. Lipid screening    PLAN:    Chest pain: Atypical in description but does have multiple CAD risk factors (tobacco use, hypertension, family history).  Overall would classify as intermediate risk for obstructive CAD and further evaluation is warranted -Recommend coronary CTA to evaluate for obstructive CAD.  Resting heart rate in low 50s, will not give beta-blocker prior to study -Echocardiogram to evaluate for structural heart disease.  Hypertension: on olmesartan 40 mg daily.  BP mildly elevated in clinic today.  Asked patient to check BP twice daily for next week and call with results.  Lipid screening: Will check lipid panel  RTC in 3 months   Medication Adjustments/Labs and Tests Ordered: Current medicines are reviewed at length with the patient today.  Concerns regarding medicines are outlined above.  Orders Placed This Encounter  Procedures   CT CORONARY MORPH W/CTA COR W/SCORE W/CA W/CM &/OR WO/CM   Lipid panel   EKG 12-Lead   ECHOCARDIOGRAM COMPLETE    Meds ordered this encounter  Medications   DISCONTD: Blood Pressure Monitoring (BLOOD PRESSURE CUFF) MISC    Sig: 1 kit by Does not apply route once for 1 dose.    Dispense:  1 each    Refill:  0   Blood Pressure Monitoring (BLOOD PRESSURE CUFF) MISC     Sig: 1 kit by Does not apply route once for 1 dose.    Dispense:  1 each    Refill:  0     Patient Instructions  Medication Instructions:  Your physician recommends that you continue on your current medications as directed. Please refer to the Current Medication list given to you today.  *If you need a refill on your cardiac medications before your next appointment, please call your pharmacy*   Lab Work: Lipid panel today  If you have labs (blood work) drawn today and your tests are completely normal, you will receive your results only by: [place] (if you have MyChart) OR A paper copy in the mail If you have any lab test that is abnormal or we need to change your treatment, we will call you to review the results.   Testing/Procedures: Coronary CTA-see instructions below  Your physician has requested that you have an echocardiogram. Echocardiography is a painless test that uses sound waves to create images of your heart. It provides your doctor with information about the size and shape of your heart and how well your heart's chambers and valves are working. This procedure takes approximately one hour. There are no restrictions for this procedure.  Follow-Up: At [facility], you and your health needs are our priority.  As part of our continuing mission to provide you with exceptional heart care, we have created designated Provider Care Teams.  These Care Teams include your primary Cardiologist (physician) and Advanced Practice Providers (APPs -  Physician Assistants and Nurse Practitioners) who all work together to provide you with the care you need, when you need it.  We recommend signing up for the patient portal called "MyChart".  Sign up information is provided on this After Visit Summary.  MyChart is used to connect with patients for Virtual Visits (Telemedicine).  Patients are able to view lab/test results, encounter notes, upcoming appointments, etc.  Non-urgent  messages can be sent to your provider as well.   To learn more about what you can do with MyChart, go to [email or web address].    Your next appointment:   3-4 month(s)  The format for your next appointment:   In Person  Provider:   Dr. Gardiner Rhyme   Other Instructions Recommend obtaining a [employer]ron Upper arm blood pressure cuff   Please check your blood pressure at home 2 times daily, write it down.  Call the office or send message via Mychart with the readings in 1 week for Dr. Gardiner Rhyme to review.      Coronary CTA instructions:  Your cardiac CT will be scheduled at one of the below locations:   [facility] [address] [place], [place] [address] [phone]  If scheduled at [facility], please arrive at the [facility] main entrance (entrance A) of [facility] 30 minutes prior to test start time. Proceed to the [facility] Radiology Department (first floor) to check-in and test prep.  Please follow these instructions carefully (unless otherwise directed):  Hold all erectile dysfunction medications at least 3 days (72 hrs) prior to test.  On the Night Before the Test: Be sure to Drink plenty of water. Do not consume any caffeinated/decaffeinated beverages or chocolate 12 hours prior to your test. Do not take any antihistamines 12 hours prior to your test.  On the Day of the Test: Drink plenty of water until 1 hour prior to the test. Do not eat any food 4 hours prior to the test. You may take your regular medications prior to the test.       After the Test: Drink plenty of water. After receiving IV contrast, you may experience a mild flushed feeling. This  is normal. On occasion, you may experience a mild rash up to 24 hours after the test. This is not dangerous. If this occurs, you can take Benadryl 25 mg and increase your fluid intake. If you experience trouble breathing, this can be serious. If it is severe call 911 IMMEDIATELY. If it  is mild, please call our office. If you take any of these medications: Glipizide/Metformin, Avandament, Glucavance, please do not take 48 hours after completing test unless otherwise instructed.   Once we have confirmed authorization from your insurance company, we will call you to set up a date and time for your test. Based on how quickly your insurance processes prior authorizations requests, please allow up to 4 weeks to be contacted for scheduling your Cardiac CT appointment. Be advised that routine Cardiac CT appointments could be scheduled as many as 8 weeks after your provider has ordered it.  For non-scheduling related questions, please contact the cardiac imaging nurse navigator should you have any questions/concerns: Marchia Bond, Cardiac Imaging Nurse Navigator Gordy Clement, Cardiac Imaging Nurse Navigator [facility] Direct Office Dial: [phone]   For scheduling needs, including cancellations and rescheduling, please call [place], [phone].    Signed, Donato Heinz, MD  09/30/2020 8:46 AM    [place]

## 2020-10-12 ENCOUNTER — Telehealth (HOSPITAL_COMMUNITY): Payer: Self-pay | Admitting: Emergency Medicine

## 2020-10-12 NOTE — Telephone Encounter (Signed)
Reaching out to patient to offer assistance regarding upcoming cardiac imaging study; pt verbalizes understanding of appt date/time, parking situation and where to check in, pre-test NPO status and medications ordered, and verified current allergies; name and call back number provided for further questions should they arise Teri Legacy RN Navigator Cardiac Imaging [facility] [phone] office [phone] cell  Denies iv issues Denies claustro  

## 2020-10-13 ENCOUNTER — Other Ambulatory Visit: Payer: Self-pay

## 2020-10-13 ENCOUNTER — Ambulatory Visit (HOSPITAL_COMMUNITY)
Admission: RE | Admit: 2020-10-13 | Discharge: 2020-10-13 | Disposition: A | Discharge status: Home / Self Care | Payer: Medicare Other | Source: Ambulatory Visit | Attending: Cardiology | Admitting: Cardiology

## 2020-10-13 DIAGNOSIS — R079 Chest pain, unspecified: Secondary | ICD-10-CM | POA: Diagnosis not present

## 2020-10-13 DIAGNOSIS — I2510 Atherosclerotic heart disease of native coronary artery without angina pectoris: Secondary | ICD-10-CM | POA: Diagnosis not present

## 2020-10-13 MED ORDER — METOPROLOL TARTRATE 5 MG/5ML IV SOLN
5.0000 mg | INTRAVENOUS | Status: DC | PRN
  Administered 2020-10-13: 5 mg via INTRAVENOUS

## 2020-10-13 MED ORDER — IOHEXOL 350 MG/ML SOLN
95.0000 mL | Freq: Once | INTRAVENOUS | Status: AC | PRN
  Administered 2020-10-13: 95 mL via INTRAVENOUS

## 2020-10-13 MED ORDER — METOPROLOL TARTRATE 5 MG/5ML IV SOLN
INTRAVENOUS | Status: AC
  Filled 2020-10-13: qty 10

## 2020-10-13 MED ORDER — NITROGLYCERIN 0.4 MG SL SUBL
SUBLINGUAL_TABLET | SUBLINGUAL | Status: AC
  Administered 2020-10-13: 0.8 mg via SUBLINGUAL
  Filled 2020-10-13: qty 2

## 2020-10-13 MED ORDER — NITROGLYCERIN 0.4 MG SL SUBL: 0.8000 mg | SUBLINGUAL_TABLET | Freq: Once | SUBLINGUAL | Status: AC

## 2020-10-14 ENCOUNTER — Ambulatory Visit (HOSPITAL_COMMUNITY)
Admission: RE | Admit: 2020-10-14 | Discharge: 2020-10-14 | Disposition: A | Discharge status: Home / Self Care | Payer: Medicare Other | Source: Ambulatory Visit | Attending: Cardiology | Admitting: Cardiology

## 2020-10-14 DIAGNOSIS — R079 Chest pain, unspecified: Secondary | ICD-10-CM | POA: Diagnosis not present

## 2020-10-14 NOTE — Progress Notes (Signed)
*  PRELIMINARY RESULTS* Echocardiogram 2D Echocardiogram has been performed.  Francisco Nash 10/14/2020, 1:50 PM

## 2020-10-15 ENCOUNTER — Ambulatory Visit (HOSPITAL_COMMUNITY)
Admission: RE | Admit: 2020-10-15 | Discharge: 2020-10-15 | Disposition: A | Discharge status: Home / Self Care | Payer: Medicare Other | Source: Ambulatory Visit | Attending: Cardiology | Admitting: Cardiology

## 2020-10-15 ENCOUNTER — Other Ambulatory Visit: Payer: Self-pay | Admitting: Cardiology

## 2020-10-15 DIAGNOSIS — I2510 Atherosclerotic heart disease of native coronary artery without angina pectoris: Secondary | ICD-10-CM | POA: Diagnosis not present

## 2020-10-15 DIAGNOSIS — R931 Abnormal findings on diagnostic imaging of heart and coronary circulation: Secondary | ICD-10-CM | POA: Diagnosis present

## 2020-10-15 LAB — ECHOCARDIOGRAM COMPLETE
Area-P 1/2: 3.43 cm2
Calc EF: 54.5 %
S' Lateral: 3.5 cm
Single Plane A2C EF: 50.9 %
Single Plane A4C EF: 59.3 %

## 2020-10-19 ENCOUNTER — Other Ambulatory Visit: Payer: Self-pay | Admitting: *Deleted

## 2020-10-19 MED ORDER — ROSUVASTATIN CALCIUM 10 MG PO TABS: 10.0000 mg | ORAL_TABLET | Freq: Every day | ORAL | 3 refills | Status: DC

## 2020-10-19 MED ORDER — ASPIRIN EC 81 MG PO TBEC: 81.0000 mg | DELAYED_RELEASE_TABLET | Freq: Every day | ORAL | 3 refills | Status: AC

## 2020-10-24 NOTE — Progress Notes (Deleted)
Cardiology Office Note:    Date:  10/24/2020   ID:  Francisco Nash, DOB 26-May-1955, MRN [id number]  PCP:  Assunta Found, MD  Cardiologist:  None  Electrophysiologist:  None   Referring MD: Assunta Found, MD   No chief complaint on file.   History of Present Illness:    Francisco Nash is a 65 y.o. male with a hx of hypertension who presents for follow-up for chest pain.  He was seen in the ED on 09/27/2020 with chest pain.  Troponins negative x2.  EKG without ischemic changes.  He reports that he has been having chest pain 2-3 times per week.  Describes as sharp stabbing pain on the left side of his chest.  Typically lasts for seconds and resolves.  No clear relationship with exertion.  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  Reports no relationship between chest pain and eating.  The day he went to the ED reports he was drinking coffee and noticed the pain on the left side of his chest.  Reports it resolved after seconds but then kept recurring every few minutes.  This happened 3-4 times, prompting him to go to the ED.  He denies any dyspnea, lightheadedness, syncope, lower extremity edema, palpitations, or leg pain with walking.  He has smoked for over 50 years up to 1 pack/day, but is currently smoking 0.25 pack/day.  Family history includes brother had CABG in 58s and he thinks another brother has CHF but he is unsure of the details.  Coronary CTA on 10/13/2020 showed calcium score 1155 (92nd percentile) with diffuse disease in LAD and dominant Lcx, CT FFR suggested hemodynamically significant lesion in mid to distal LAD (CT FFR 0.75).  Echocardiogram on 10/15/2020 showed LVEF 50 to 55%, normal RV function, no significant valvular disease.  Sine last clinic visit,   BP Readings from Last 3 Encounters:  10/13/20 102/70  09/30/20 140/81  09/27/20 (!) 147/96     Past Medical History:  Diagnosis Date   Hypertension     No past surgical history on file.  Current  Medications: No outpatient medications have been marked as taking for the 10/28/20 encounter (Appointment) with Little Ishikawa, MD.     Allergies:   Patient has no known allergies.   Social History   Socioeconomic History   Marital status: Widowed    Spouse name: Not on file   Number of children: Not on file   Years of education: Not on file   Highest education level: Not on file  Occupational History   Not on file  Tobacco Use   Smoking status: Some Days    Packs/day: 0.50    Types: Cigarettes   Smokeless tobacco: Never  Substance and Sexual Activity   Alcohol use: No   Drug use: No   Sexual activity: Not on file  Other Topics Concern   Not on file  Social History Narrative   Not on file   Social Determinants of Health   Financial Resource Strain: Not on file  Food Insecurity: Not on file  Transportation Needs: Not on file  Physical Activity: Not on file  Stress: Not on file  Social Connections: Not on file     Family History: Family history includes brother had CABG in 35s and he thinks another brother has CHF but he is unsure of the details.  ROS:   Please see the history of present illness.     All  other systems reviewed and are negative.  EKGs/Labs/Other Studies Reviewed:    The following studies were reviewed today:   EKG:  EKG is ordered today.  The ekg ordered today demonstrates sinus bradycardia, rate 53, no ST abnormalities  Recent Labs: 09/27/2020: BUN 9; Creatinine, Ser 0.86; Hemoglobin 16.1; Platelets 170; Potassium 4.1; Sodium 136  Recent Lipid Panel    Component Value Date/Time   CHOL 207 (H) 09/30/2020 0905   TRIG 79 09/30/2020 0905   HDL 53 09/30/2020 0905   CHOLHDL 3.9 09/30/2020 0905   CHOLHDL 5.2 06/06/2007 0200   VLDL 20 06/06/2007 0200   LDLCALC 140 (H) 09/30/2020 0905    Physical Exam:    VS:  There were no vitals taken for this visit.    Wt Readings from Last 3 Encounters:  09/30/20 164 lb 12.8 oz (74.8 kg)   09/27/20 162 lb (73.5 kg)  11/27/17 180 lb (81.6 kg)     GEN:  Well nourished, well developed in no acute distress HEENT: Normal NECK: No JVD; No carotid bruits LYMPHATICS: No lymphadenopathy CARDIAC: distant heart sounds, RRR, no murmurs RESPIRATORY:  Clear to auscultation without rales, wheezing or rhonchi  ABDOMEN: Soft, non-tender, non-distended MUSCULOSKELETAL:  No edema; No deformity  SKIN: Warm and dry NEUROLOGIC:  Alert and oriented x 3 PSYCHIATRIC:  Normal affect   ASSESSMENT:    No diagnosis found.  PLAN:    CAD: reported atypical chest pain.  Coronary CTA on 10/13/2020 showed calcium score 1155 (92nd percentile) with diffuse disease in LAD and dominant Lcx, CT FFR suggested hemodynamically significant lesion in mid to distal LAD (CT FFR 0.75).  Echocardiogram on 10/15/2020 showed LVEF 50 to 55%, normal RV function, no significant valvular disease. -Recommend LHC.  Risks and benefits of cardiac catheterization have been discussed with the patient.  These include bleeding, infection, kidney damage, stroke, heart attack, death.  The patient understands these risks and is willing to proceed. -Start ASA 81 mg daily -Start rosuvastatin 10 mg daily -PRN SL NTG  Hypertension: on olmesartan 40 mg daily.  Hyperlipidemia: LDL 140 on 09/30/2020.  Start rosuvastatin 10 mg daily  RTC in 3 months   Medication Adjustments/Labs and Tests Ordered: Current medicines are reviewed at length with the patient today.  Concerns regarding medicines are outlined above.  No orders of the defined types were placed in this encounter.   No orders of the defined types were placed in this encounter.    There are no Patient Instructions on file for this visit.   Signed, Little Ishikawa, MD  10/24/2020 9:23 PM    [facility]

## 2020-10-27 NOTE — Progress Notes (Signed)
Cardiology Office Note:    Date:  10/28/2020   ID:  Francisco Nash, DOB 1956/01/31, MRN [id number]  PCP:  Assunta Found, MD  Cardiologist:  None  Electrophysiologist:  None   Referring MD: Assunta Found, MD   Chief Complaint  Patient presents with   Chest Pain     History of Present Illness:    Francisco Nash is a 65 y.o. male with a hx of hypertension who presents for follow-up for chest pain.  He was seen in the ED on 09/27/2020 with chest pain.  Troponins negative x2.  EKG without ischemic changes.  He reports that he has been having chest pain 2-3 times per week.  Describes as sharp stabbing pain on the left side of his chest.  Typically lasts for seconds and resolves.  No clear relationship with exertion.  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  Reports no relationship between chest pain and eating.  The day he went to the ED reports he was drinking coffee and noticed the pain on the left side of his chest.  Reports it resolved after seconds but then kept recurring every few minutes.  This happened 3-4 times, prompting him to go to the ED.  He denies any dyspnea, lightheadedness, syncope, lower extremity edema, palpitations, or leg pain with walking.  He has smoked for over 50 years up to 1 pack/day, but is currently smoking 0.25 pack/day.  Family history includes brother had CABG in 90s and he thinks another brother has CHF but he is unsure of the details.  Coronary CTA on 10/13/2020 showed calcium score 1155 (92nd percentile) with diffuse disease in LAD and dominant Lcx, CT FFR suggested hemodynamically significant lesion in mid to distal LAD (CT FFR 0.75).  Echocardiogram on 10/15/2020 showed LVEF 50 to 55%, normal RV function, no significant valvular disease.  Since last clinic visit, he is doing OK. He continues to have intermittent chest pain, lasting seconds.  Chest pain is located on left side of chest and describes the pain to feel sharp.  Can occur at rest or  with exertion.  Denies shortness of breath, palpitations or LE edema. He is currently active at his job and has recently started walking a mile with no complications.   BP Readings from Last 3 Encounters:  10/28/20 110/70  10/13/20 102/70  09/30/20 140/81     Past Medical History:  Diagnosis Date   Hypertension     No past surgical history on file.  Current Medications: Current Meds  Medication Sig   aspirin EC 81 MG tablet Take 1 tablet (81 mg total) by mouth daily. Swallow whole.   nitroGLYCERIN (NITROSTAT) 0.4 MG SL tablet Place 1 tablet (0.4 mg total) under the tongue every 5 (five) minutes as needed.   olmesartan (BENICAR) 40 MG tablet Take 40 mg by mouth daily.   rosuvastatin (CRESTOR) 10 MG tablet Take 1 tablet (10 mg total) by mouth daily.     Allergies:   Patient has no known allergies.   Social History   Socioeconomic History   Marital status: Widowed    Spouse name: Not on file   Number of children: Not on file   Years of education: Not on file   Highest education level: Not on file  Occupational History   Not on file  Tobacco Use   Smoking status: Some Days    Packs/day: 0.50    Types: Cigarettes   Smokeless tobacco: Never  Substance and Sexual Activity   Alcohol use: No   Drug use: No   Sexual activity: Not on file  Other Topics Concern   Not on file  Social History Narrative   Not on file   Social Determinants of Health   Financial Resource Strain: Not on file  Food Insecurity: Not on file  Transportation Needs: Not on file  Physical Activity: Not on file  Stress: Not on file  Social Connections: Not on file     Family History: Family history includes brother had CABG in 28s and he thinks another brother has CHF but he is unsure of the details.  ROS:   Please see the history of present illness.     (+)chest pains  All other systems reviewed and are negative.  EKGs/Labs/Other Studies Reviewed:    The following studies were reviewed  today:  Echo 07/22: IMPRESSIONS    1. Endocardium is difficult in apical views. Short axis images show no  wall motion abnormalities . Left ventricular ejection fraction, by  estimation, is 50 to 55%. The left ventricle has low normal function. Left  ventricular diastolic parameters are  indeterminate.   2. Right ventricular systolic function is normal. The right ventricular  size is normal.   3. The mitral valve is normal in structure. Trivial mitral valve  regurgitation.   4. The aortic valve is tricuspid. Aortic valve regurgitation is not  visualized. Mild to moderate aortic valve sclerosis/calcification is  present, without any evidence of aortic stenosis.   5. The inferior vena cava is normal in size with greater than 50%  respiratory variability, suggesting right atrial pressure of 3 mmHg.   EKG:  07/22: sinus bradycardia, rate 50, no ST abnormalities 06/22:sinus bradycardia, rate 53, no ST abnormalities  Recent Labs: 09/27/2020: BUN 9; Creatinine, Ser 0.86; Hemoglobin 16.1; Platelets 170; Potassium 4.1; Sodium 136  Recent Lipid Panel    Component Value Date/Time   CHOL 207 (H) 09/30/2020 0905   TRIG 79 09/30/2020 0905   HDL 53 09/30/2020 0905   CHOLHDL 3.9 09/30/2020 0905   CHOLHDL 5.2 06/06/2007 0200   VLDL 20 06/06/2007 0200   LDLCALC 140 (H) 09/30/2020 0905    Physical Exam:    VS:  BP 110/70 (BP Location: Left Arm)   Pulse (!) 50   Ht 5\' 9"  (1.753 m)   Wt 167 lb 3.2 oz (75.8 kg)   SpO2 98%   BMI 24.69 kg/m     Wt Readings from Last 3 Encounters:  10/28/20 167 lb 3.2 oz (75.8 kg)  09/30/20 164 lb 12.8 oz (74.8 kg)  09/27/20 162 lb (73.5 kg)     GEN:  Well nourished, well developed in no acute distress HEENT: Normal NECK: No JVD; No carotid bruits CARDIAC:  distant heart sounds, RRR, no murmurs RESPIRATORY:  Clear to auscultation without rales, wheezing or rhonchi  ABDOMEN: Soft, non-tender, non-distended MUSCULOSKELETAL:  No edema; No deformity   SKIN: Warm and dry NEUROLOGIC:  Alert and oriented x 3 PSYCHIATRIC:  Normal affect   ASSESSMENT:    1. Abnormal cardiac CT angiography   2. Chest pain of uncertain etiology   3. Pre-procedure lab exam   4. Essential hypertension   5. Hyperlipidemia, unspecified hyperlipidemia type     PLAN:    CAD: reported atypical chest pain.  Coronary CTA on 10/13/2020 showed calcium score 1155 (92nd percentile) with diffuse disease in LAD and dominant Lcx, CT FFR suggested hemodynamically significant lesion in mid to distal  LAD (CT FFR 0.75).  Echocardiogram on 10/15/2020 showed LVEF 50 to 55%, normal RV function, no significant valvular disease. -Recommend LHC.  Risks and benefits of cardiac catheterization have been discussed with the patient.  These include bleeding, infection, kidney damage, stroke, heart attack, death.  The patient understands these risks and is willing to proceed. -Start ASA 81 mg daily -Start rosuvastatin 10 mg daily -PRN SL NTG  Hypertension: on olmesartan 40 mg daily.  Appears controlled  Hyperlipidemia: LDL 140 on 09/30/2020.  Start rosuvastatin 10 mg daily  RTC in 3 months   Medication Adjustments/Labs and Tests Ordered: Current medicines are reviewed at length with the patient today.  Concerns regarding medicines are outlined above.  Orders Placed This Encounter  Procedures   Basic metabolic panel   CBC   EKG 12-Lead     Meds ordered this encounter  Medications   nitroGLYCERIN (NITROSTAT) 0.4 MG SL tablet    Sig: Place 1 tablet (0.4 mg total) under the tongue every 5 (five) minutes as needed.    Dispense:  25 tablet    Refill:  3      Patient Instructions  Medication Instructions:  Take sublingual nitroglycerin AS NEEDED for chest pain  *If you need a refill on your cardiac medications before your next appointment, please call your pharmacy*   Lab Work: BMET, CBC today  If you have labs (blood work) drawn today and your tests are completely  normal, you will receive your results only by: MyChart Message (if you have MyChart) OR A paper copy in the mail If you have any lab test that is abnormal or we need to change your treatment, we will call you to review the results.   Testing/Procedures: Your physician has requested that you have a cardiac catheterization. Cardiac catheterization is used to diagnose and/or treat various heart conditions. Doctors may recommend this procedure for a number of different reasons. The most common reason is to evaluate chest pain. Chest pain can be a symptom of coronary artery disease (CAD), and cardiac catheterization can show whether plaque is narrowing or blocking your heart's arteries. This procedure is also used to evaluate the valves, as well as measure the blood flow and oxygen levels in different parts of your heart. For further information please visit [email or web address] Please follow instruction sheet, as given.  Follow-Up: At [facility], you and your health needs are our priority.  As part of our continuing mission to provide you with exceptional heart care, we have created designated Provider Care Teams.  These Care Teams include your primary Cardiologist (physician) and Advanced Practice Providers (APPs -  Physician Assistants and Nurse Practitioners) who all work together to provide you with the care you need, when you need it.  We recommend signing up for the patient portal called "MyChart".  Sign up information is provided on this After Visit Summary.  MyChart is used to connect with patients for Virtual Visits (Telemedicine).  Patients are able to view lab/test results, encounter notes, upcoming appointments, etc.  Non-urgent messages can be sent to your provider as well.   To learn more about what you can do with MyChart, go to [email or web address].    Your next appointment:   Tuesday, 8/23 at 3:40 PM with Dr. Kurtis Bushman as a scribe for Little Ishikawa, MD.,have documented all relevant documentation on the behalf of Little Ishikawa, MD,as directed by  Little Ishikawa, MD while in the presence of [facility]  Karlyne GreenspanL Balen Woolum, MD.  I, Little Ishikawahristopher L Emlyn Maves, MD, have reviewed all documentation for this visit. The documentation on 10/28/20 for the exam, diagnosis, procedures, and orders are all accurate and complete.   Signed, Little Ishikawahristopher L Jadamarie Butson, MD  10/28/2020 5:37 PM    [facility]

## 2020-10-27 NOTE — H&P (View-Only) (Signed)
Cardiology Office Note:    Date:  10/28/2020   ID:  Francisco Nash, DOB 1956/01/31, MRN [id number]  PCP:  Assunta Found, MD  Cardiologist:  None  Electrophysiologist:  None   Referring MD: Assunta Found, MD   Chief Complaint  Patient presents with   Chest Pain     History of Present Illness:    Francisco Nash is a 65 y.o. male with a hx of hypertension who presents for follow-up for chest pain.  He was seen in the ED on 09/27/2020 with chest pain.  Troponins negative x2.  EKG without ischemic changes.  He reports that he has been having chest pain 2-3 times per week.  Describes as sharp stabbing pain on the left side of his chest.  Typically lasts for seconds and resolves.  No clear relationship with exertion.  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  Reports no relationship between chest pain and eating.  The day he went to the ED reports he was drinking coffee and noticed the pain on the left side of his chest.  Reports it resolved after seconds but then kept recurring every few minutes.  This happened 3-4 times, prompting him to go to the ED.  He denies any dyspnea, lightheadedness, syncope, lower extremity edema, palpitations, or leg pain with walking.  He has smoked for over 50 years up to 1 pack/day, but is currently smoking 0.25 pack/day.  Family history includes brother had CABG in 90s and he thinks another brother has CHF but he is unsure of the details.  Coronary CTA on 10/13/2020 showed calcium score 1155 (92nd percentile) with diffuse disease in LAD and dominant Lcx, CT FFR suggested hemodynamically significant lesion in mid to distal LAD (CT FFR 0.75).  Echocardiogram on 10/15/2020 showed LVEF 50 to 55%, normal RV function, no significant valvular disease.  Since last clinic visit, he is doing OK. He continues to have intermittent chest pain, lasting seconds.  Chest pain is located on left side of chest and describes the pain to feel sharp.  Can occur at rest or  with exertion.  Denies shortness of breath, palpitations or LE edema. He is currently active at his job and has recently started walking a mile with no complications.   BP Readings from Last 3 Encounters:  10/28/20 110/70  10/13/20 102/70  09/30/20 140/81     Past Medical History:  Diagnosis Date   Hypertension     No past surgical history on file.  Current Medications: Current Meds  Medication Sig   aspirin EC 81 MG tablet Take 1 tablet (81 mg total) by mouth daily. Swallow whole.   nitroGLYCERIN (NITROSTAT) 0.4 MG SL tablet Place 1 tablet (0.4 mg total) under the tongue every 5 (five) minutes as needed.   olmesartan (BENICAR) 40 MG tablet Take 40 mg by mouth daily.   rosuvastatin (CRESTOR) 10 MG tablet Take 1 tablet (10 mg total) by mouth daily.     Allergies:   Patient has no known allergies.   Social History   Socioeconomic History   Marital status: Widowed    Spouse name: Not on file   Number of children: Not on file   Years of education: Not on file   Highest education level: Not on file  Occupational History   Not on file  Tobacco Use   Smoking status: Some Days    Packs/day: 0.50    Types: Cigarettes   Smokeless tobacco: Never  Substance and Sexual Activity   Alcohol use: No   Drug use: No   Sexual activity: Not on file  Other Topics Concern   Not on file  Social History Narrative   Not on file   Social Determinants of Health   Financial Resource Strain: Not on file  Food Insecurity: Not on file  Transportation Needs: Not on file  Physical Activity: Not on file  Stress: Not on file  Social Connections: Not on file     Family History: Family history includes brother had CABG in 28s and he thinks another brother has CHF but he is unsure of the details.  ROS:   Please see the history of present illness.     (+)chest pains  All other systems reviewed and are negative.  EKGs/Labs/Other Studies Reviewed:    The following studies were reviewed  today:  Echo 07/22: IMPRESSIONS    1. Endocardium is difficult in apical views. Short axis images show no  wall motion abnormalities . Left ventricular ejection fraction, by  estimation, is 50 to 55%. The left ventricle has low normal function. Left  ventricular diastolic parameters are  indeterminate.   2. Right ventricular systolic function is normal. The right ventricular  size is normal.   3. The mitral valve is normal in structure. Trivial mitral valve  regurgitation.   4. The aortic valve is tricuspid. Aortic valve regurgitation is not  visualized. Mild to moderate aortic valve sclerosis/calcification is  present, without any evidence of aortic stenosis.   5. The inferior vena cava is normal in size with greater than 50%  respiratory variability, suggesting right atrial pressure of 3 mmHg.   EKG:  07/22: sinus bradycardia, rate 50, no ST abnormalities 06/22:sinus bradycardia, rate 53, no ST abnormalities  Recent Labs: 09/27/2020: BUN 9; Creatinine, Ser 0.86; Hemoglobin 16.1; Platelets 170; Potassium 4.1; Sodium 136  Recent Lipid Panel    Component Value Date/Time   CHOL 207 (H) 09/30/2020 0905   TRIG 79 09/30/2020 0905   HDL 53 09/30/2020 0905   CHOLHDL 3.9 09/30/2020 0905   CHOLHDL 5.2 06/06/2007 0200   VLDL 20 06/06/2007 0200   LDLCALC 140 (H) 09/30/2020 0905    Physical Exam:    VS:  BP 110/70 (BP Location: Left Arm)   Pulse (!) 50   Ht 5\' 9"  (1.753 m)   Wt 167 lb 3.2 oz (75.8 kg)   SpO2 98%   BMI 24.69 kg/m     Wt Readings from Last 3 Encounters:  10/28/20 167 lb 3.2 oz (75.8 kg)  09/30/20 164 lb 12.8 oz (74.8 kg)  09/27/20 162 lb (73.5 kg)     GEN:  Well nourished, well developed in no acute distress HEENT: Normal NECK: No JVD; No carotid bruits CARDIAC:  distant heart sounds, RRR, no murmurs RESPIRATORY:  Clear to auscultation without rales, wheezing or rhonchi  ABDOMEN: Soft, non-tender, non-distended MUSCULOSKELETAL:  No edema; No deformity   SKIN: Warm and dry NEUROLOGIC:  Alert and oriented x 3 PSYCHIATRIC:  Normal affect   ASSESSMENT:    1. Abnormal cardiac CT angiography   2. Chest pain of uncertain etiology   3. Pre-procedure lab exam   4. Essential hypertension   5. Hyperlipidemia, unspecified hyperlipidemia type     PLAN:    CAD: reported atypical chest pain.  Coronary CTA on 10/13/2020 showed calcium score 1155 (92nd percentile) with diffuse disease in LAD and dominant Lcx, CT FFR suggested hemodynamically significant lesion in mid to distal  LAD (CT FFR 0.75).  Echocardiogram on 10/15/2020 showed LVEF 50 to 55%, normal RV function, no significant valvular disease. -Recommend LHC.  Risks and benefits of cardiac catheterization have been discussed with the patient.  These include bleeding, infection, kidney damage, stroke, heart attack, death.  The patient understands these risks and is willing to proceed. -Start ASA 81 mg daily -Start rosuvastatin 10 mg daily -PRN SL NTG  Hypertension: on olmesartan 40 mg daily.  Appears controlled  Hyperlipidemia: LDL 140 on 09/30/2020.  Start rosuvastatin 10 mg daily  RTC in 3 months   Medication Adjustments/Labs and Tests Ordered: Current medicines are reviewed at length with the patient today.  Concerns regarding medicines are outlined above.  Orders Placed This Encounter  Procedures   Basic metabolic panel   CBC   EKG 12-Lead     Meds ordered this encounter  Medications   nitroGLYCERIN (NITROSTAT) 0.4 MG SL tablet    Sig: Place 1 tablet (0.4 mg total) under the tongue every 5 (five) minutes as needed.    Dispense:  25 tablet    Refill:  3      Patient Instructions  Medication Instructions:  Take sublingual nitroglycerin AS NEEDED for chest pain  *If you need a refill on your cardiac medications before your next appointment, please call your pharmacy*   Lab Work: BMET, CBC today  If you have labs (blood work) drawn today and your tests are completely  normal, you will receive your results only by: MyChart Message (if you have MyChart) OR A paper copy in the mail If you have any lab test that is abnormal or we need to change your treatment, we will call you to review the results.   Testing/Procedures: Your physician has requested that you have a cardiac catheterization. Cardiac catheterization is used to diagnose and/or treat various heart conditions. Doctors may recommend this procedure for a number of different reasons. The most common reason is to evaluate chest pain. Chest pain can be a symptom of coronary artery disease (CAD), and cardiac catheterization can show whether plaque is narrowing or blocking your heart's arteries. This procedure is also used to evaluate the valves, as well as measure the blood flow and oxygen levels in different parts of your heart. For further information please visit [email or web address] Please follow instruction sheet, as given.  Follow-Up: At [facility], you and your health needs are our priority.  As part of our continuing mission to provide you with exceptional heart care, we have created designated Provider Care Teams.  These Care Teams include your primary Cardiologist (physician) and Advanced Practice Providers (APPs -  Physician Assistants and Nurse Practitioners) who all work together to provide you with the care you need, when you need it.  We recommend signing up for the patient portal called "MyChart".  Sign up information is provided on this After Visit Summary.  MyChart is used to connect with patients for Virtual Visits (Telemedicine).  Patients are able to view lab/test results, encounter notes, upcoming appointments, etc.  Non-urgent messages can be sent to your provider as well.   To learn more about what you can do with MyChart, go to [email or web address].    Your next appointment:   Tuesday, 8/23 at 3:40 PM with Dr. Kurtis Bushman as a scribe for Little Ishikawa, MD.,have documented all relevant documentation on the behalf of Little Ishikawa, MD,as directed by  Little Ishikawa, MD while in the presence of [facility]  L Staphanie Harbison, MD.  I, Taetum Flewellen L Tajanay Hurley, MD, have reviewed all documentation for this visit. The documentation on 10/28/20 for the exam, diagnosis, procedures, and orders are all accurate and complete.   Signed, Marvelous Woolford L Idaly Verret, MD  10/28/2020 5:37 PM    [facility]  

## 2020-10-28 ENCOUNTER — Other Ambulatory Visit: Payer: Self-pay

## 2020-10-28 ENCOUNTER — Encounter: Payer: Self-pay | Admitting: Cardiology

## 2020-10-28 ENCOUNTER — Ambulatory Visit (INDEPENDENT_AMBULATORY_CARE_PROVIDER_SITE_OTHER): Payer: Medicare Other | Admitting: Cardiology

## 2020-10-28 VITALS — BP 110/70 | HR 50 | Ht 69.0 in | Wt 167.2 lb

## 2020-10-28 DIAGNOSIS — R931 Abnormal findings on diagnostic imaging of heart and coronary circulation: Secondary | ICD-10-CM

## 2020-10-28 DIAGNOSIS — I10 Essential (primary) hypertension: Secondary | ICD-10-CM

## 2020-10-28 DIAGNOSIS — Z01818 Encounter for other preprocedural examination: Secondary | ICD-10-CM

## 2020-10-28 DIAGNOSIS — Z01812 Encounter for preprocedural laboratory examination: Secondary | ICD-10-CM

## 2020-10-28 DIAGNOSIS — E785 Hyperlipidemia, unspecified: Secondary | ICD-10-CM

## 2020-10-28 DIAGNOSIS — R079 Chest pain, unspecified: Secondary | ICD-10-CM | POA: Diagnosis not present

## 2020-10-28 MED ORDER — NITROGLYCERIN 0.4 MG SL SUBL: 0.4000 mg | SUBLINGUAL_TABLET | SUBLINGUAL | 3 refills | Status: AC | PRN

## 2020-10-28 NOTE — Patient Instructions (Addendum)
Medication Instructions:  Take sublingual nitroglycerin AS NEEDED for chest pain  *If you need a refill on your cardiac medications before your next appointment, please call your pharmacy*   Lab Work: BMET, CBC today  If you have labs (blood work) drawn today and your tests are completely normal, you will receive your results only by: MyChart Message (if you have MyChart) OR A paper copy in the mail If you have any lab test that is abnormal or we need to change your treatment, we will call you to review the results.   Testing/Procedures: Your physician has requested that you have a cardiac catheterization. Cardiac catheterization is used to diagnose and/or treat various heart conditions. Doctors may recommend this procedure for a number of different reasons. The most common reason is to evaluate chest pain. Chest pain can be a symptom of coronary artery disease (CAD), and cardiac catheterization can show whether plaque is narrowing or blocking your heart's arteries. This procedure is also used to evaluate the valves, as well as measure the blood flow and oxygen levels in different parts of your heart. For further information please visit [email or web address] Please follow instruction sheet, as given.  Follow-Up: At [facility], you and your health needs are our priority.  As part of our continuing mission to provide you with exceptional heart care, we have created designated Provider Care Teams.  These Care Teams include your primary Cardiologist (physician) and Advanced Practice Providers (APPs -  Physician Assistants and Nurse Practitioners) who all work together to provide you with the care you need, when you need it.  We recommend signing up for the patient portal called "MyChart".  Sign up information is provided on this After Visit Summary.  MyChart is used to connect with patients for Virtual Visits (Telemedicine).  Patients are able to view lab/test results, encounter notes, upcoming  appointments, etc.  Non-urgent messages can be sent to your provider as well.   To learn more about what you can do with MyChart, go to [email or web address].    Your next appointment:   Tuesday, 8/23 at 3:40 PM with Dr. Bjorn Pippin

## 2020-10-29 ENCOUNTER — Other Ambulatory Visit: Payer: Self-pay | Admitting: *Deleted

## 2020-10-29 DIAGNOSIS — R079 Chest pain, unspecified: Secondary | ICD-10-CM

## 2020-10-29 DIAGNOSIS — R931 Abnormal findings on diagnostic imaging of heart and coronary circulation: Secondary | ICD-10-CM

## 2020-10-29 LAB — CBC
Hematocrit: 47.4 % (ref 37.5–51.0)
Hemoglobin: 16.3 g/dL (ref 13.0–17.7)
MCH: 29.6 pg (ref 26.6–33.0)
MCHC: 34.4 g/dL (ref 31.5–35.7)
MCV: 86 fL (ref 79–97)
Platelets: 222 10*3/uL (ref 150–450)
RBC: 5.51 x10E6/uL (ref 4.14–5.80)
RDW: 12.7 % (ref 11.6–15.4)
WBC: 9.2 10*3/uL (ref 3.4–10.8)

## 2020-10-29 LAB — BASIC METABOLIC PANEL
BUN/Creatinine Ratio: 14 (ref 10–24)
BUN: 14 mg/dL (ref 8–27)
CO2: 27 mmol/L (ref 20–29)
Calcium: 9.3 mg/dL (ref 8.6–10.2)
Chloride: 103 mmol/L (ref 96–106)
Creatinine, Ser: 0.99 mg/dL (ref 0.76–1.27)
Glucose: 84 mg/dL (ref 65–99)
Potassium: 4.5 mmol/L (ref 3.5–5.2)
Sodium: 143 mmol/L (ref 134–144)
eGFR: 84 mL/min/{1.73_m2} (ref >59)

## 2020-10-29 MED ORDER — SODIUM CHLORIDE 0.9% FLUSH: 3.0000 mL | Freq: Two times a day (BID) | INTRAVENOUS | Status: AC

## 2020-11-01 ENCOUNTER — Encounter (HOSPITAL_COMMUNITY): Payer: Self-pay | Admitting: Cardiology

## 2020-11-01 ENCOUNTER — Ambulatory Visit (HOSPITAL_COMMUNITY)
Admission: RE | Admit: 2020-11-01 | Discharge: 2020-11-01 | Disposition: A | Discharge status: Home / Self Care | Payer: Medicare Other | Attending: Cardiology | Admitting: Cardiology

## 2020-11-01 ENCOUNTER — Other Ambulatory Visit: Payer: Self-pay

## 2020-11-01 ENCOUNTER — Encounter (HOSPITAL_COMMUNITY)
Admission: RE | Disposition: A | Discharge status: Home / Self Care | Payer: Self-pay | Source: Home / Self Care | Attending: Cardiology

## 2020-11-01 DIAGNOSIS — Z7982 Long term (current) use of aspirin: Secondary | ICD-10-CM | POA: Insufficient documentation

## 2020-11-01 DIAGNOSIS — R931 Abnormal findings on diagnostic imaging of heart and coronary circulation: Secondary | ICD-10-CM | POA: Diagnosis present

## 2020-11-01 DIAGNOSIS — Z8249 Family history of ischemic heart disease and other diseases of the circulatory system: Secondary | ICD-10-CM | POA: Insufficient documentation

## 2020-11-01 DIAGNOSIS — R079 Chest pain, unspecified: Secondary | ICD-10-CM

## 2020-11-01 DIAGNOSIS — I10 Essential (primary) hypertension: Secondary | ICD-10-CM | POA: Insufficient documentation

## 2020-11-01 DIAGNOSIS — I208 Other forms of angina pectoris: Secondary | ICD-10-CM | POA: Clinically undetermined

## 2020-11-01 DIAGNOSIS — F17210 Nicotine dependence, cigarettes, uncomplicated: Secondary | ICD-10-CM | POA: Diagnosis not present

## 2020-11-01 DIAGNOSIS — E785 Hyperlipidemia, unspecified: Secondary | ICD-10-CM | POA: Diagnosis not present

## 2020-11-01 DIAGNOSIS — I2089 Other forms of angina pectoris: Secondary | ICD-10-CM | POA: Clinically undetermined

## 2020-11-01 DIAGNOSIS — I25118 Atherosclerotic heart disease of native coronary artery with other forms of angina pectoris: Secondary | ICD-10-CM | POA: Diagnosis not present

## 2020-11-01 DIAGNOSIS — Z79899 Other long term (current) drug therapy: Secondary | ICD-10-CM | POA: Diagnosis not present

## 2020-11-01 HISTORY — PX: LEFT HEART CATH AND CORONARY ANGIOGRAPHY: CATH118249

## 2020-11-01 SURGERY — LEFT HEART CATH AND CORONARY ANGIOGRAPHY
Anesthesia: LOCAL

## 2020-11-01 MED ORDER — MIDAZOLAM HCL 2 MG/2ML IJ SOLN
INTRAMUSCULAR | Status: DC | PRN
  Administered 2020-11-01: 1 mg via INTRAVENOUS

## 2020-11-01 MED ORDER — HEPARIN (PORCINE) IN NACL 1000-0.9 UT/500ML-% IV SOLN
INTRAVENOUS | Status: DC | PRN
  Administered 2020-11-01 (×2): 500 mL

## 2020-11-01 MED ORDER — IOHEXOL 350 MG/ML SOLN
INTRAVENOUS | Status: DC | PRN
  Administered 2020-11-01: 45 mL

## 2020-11-01 MED ORDER — FENTANYL CITRATE (PF) 100 MCG/2ML IJ SOLN
INTRAMUSCULAR | Status: DC | PRN
  Administered 2020-11-01: 25 ug via INTRAVENOUS

## 2020-11-01 MED ORDER — VERAPAMIL HCL 2.5 MG/ML IV SOLN
INTRAVENOUS | Status: AC
  Filled 2020-11-01: qty 2

## 2020-11-01 MED ORDER — HEPARIN SODIUM (PORCINE) 1000 UNIT/ML IJ SOLN
INTRAMUSCULAR | Status: AC
  Filled 2020-11-01: qty 1

## 2020-11-01 MED ORDER — LIDOCAINE HCL (PF) 1 % IJ SOLN
INTRAMUSCULAR | Status: AC
  Filled 2020-11-01: qty 30

## 2020-11-01 MED ORDER — FENTANYL CITRATE (PF) 100 MCG/2ML IJ SOLN
INTRAMUSCULAR | Status: AC
  Filled 2020-11-01: qty 2

## 2020-11-01 MED ORDER — MIDAZOLAM HCL 2 MG/2ML IJ SOLN
INTRAMUSCULAR | Status: AC
  Filled 2020-11-01: qty 2

## 2020-11-01 MED ORDER — HEPARIN SODIUM (PORCINE) 1000 UNIT/ML IJ SOLN
INTRAMUSCULAR | Status: DC | PRN
  Administered 2020-11-01: 4000 [IU] via INTRAVENOUS

## 2020-11-01 MED ORDER — SODIUM CHLORIDE 0.9% FLUSH: 3.0000 mL | INTRAVENOUS | Status: DC | PRN

## 2020-11-01 MED ORDER — VERAPAMIL HCL 2.5 MG/ML IV SOLN
INTRAVENOUS | Status: DC | PRN
  Administered 2020-11-01: 10 mL via INTRA_ARTERIAL

## 2020-11-01 MED ORDER — ASPIRIN 81 MG PO CHEW: 81.0000 mg | CHEWABLE_TABLET | ORAL | Status: DC

## 2020-11-01 MED ORDER — LIDOCAINE HCL (PF) 1 % IJ SOLN
INTRAMUSCULAR | Status: DC | PRN
  Administered 2020-11-01: 2 mL via SUBCUTANEOUS

## 2020-11-01 MED ORDER — SODIUM CHLORIDE 0.9 % WEIGHT BASED INFUSION
3.0000 mL/kg/h | INTRAVENOUS | Status: AC
Start: 2020-11-01 — End: 2020-11-01
  Administered 2020-11-01: 3 mL/kg/h via INTRAVENOUS

## 2020-11-01 MED ORDER — HEPARIN (PORCINE) IN NACL 1000-0.9 UT/500ML-% IV SOLN
INTRAVENOUS | Status: AC
  Filled 2020-11-01: qty 1000

## 2020-11-01 MED ORDER — SODIUM CHLORIDE 0.9 % WEIGHT BASED INFUSION: 1.0000 mL/kg/h | INTRAVENOUS | Status: DC

## 2020-11-01 MED ORDER — SODIUM CHLORIDE 0.9 % IV SOLN: 250.0000 mL | INTRAVENOUS | Status: DC | PRN

## 2020-11-01 SURGICAL SUPPLY — 11 items
CATH OPTITORQUE TIG 4.0 5F (CATHETERS) ×2 IMPLANT
DEVICE RAD COMP TR BAND LRG (VASCULAR PRODUCTS) ×2 IMPLANT
GLIDESHEATH SLEND SS 6F .021 (SHEATH) ×2 IMPLANT
GUIDEWIRE INQWIRE 1.5J.035X260 (WIRE) ×1 IMPLANT
INQWIRE 1.5J .035X260CM (WIRE) ×2
KIT HEART LEFT (KITS) ×2 IMPLANT
PACK CARDIAC CATHETERIZATION (CUSTOM PROCEDURE TRAY) ×2 IMPLANT
SHEATH PROBE COVER 6X72 (BAG) ×2 IMPLANT
SYR MEDRAD MARK 7 150ML (SYRINGE) ×2 IMPLANT
TRANSDUCER W/STOPCOCK (MISCELLANEOUS) ×2 IMPLANT
TUBING CIL FLEX 10 FLL-RA (TUBING) ×2 IMPLANT

## 2020-11-01 NOTE — Interval H&P Note (Signed)
History and Physical Interval Note:  11/01/2020 10:08 AM  Francisco Nash  has presented today for surgery, with the diagnosis of abrnormal coronary CT, chest pain / angina.  The various methods of treatment have been discussed with the patient and family. After consideration of risks, benefits and other options for treatment, the patient has consented to  Procedure(s): LEFT HEART CATH AND CORONARY ANGIOGRAPHY (N/A) as a surgical intervention.  The patient's history has been reviewed, patient examined, no change in status, stable for surgery.  I have reviewed the patient's chart and labs.  Questions were answered to the patient's satisfaction.    Cath Lab Visit (complete for each Cath Lab visit)  Clinical Evaluation Leading to the Procedure:   ACS: No.  Non-ACS:    Anginal Classification: CCS II  Anti-ischemic medical therapy: Minimal Therapy (1 class of medications)  Non-Invasive Test Results: Equivocal test results  Prior CABG: No previous CABG   Bryan Lemma

## 2020-11-30 ENCOUNTER — Other Ambulatory Visit: Payer: Self-pay

## 2020-11-30 ENCOUNTER — Ambulatory Visit (INDEPENDENT_AMBULATORY_CARE_PROVIDER_SITE_OTHER): Payer: Medicare Other | Admitting: Cardiology

## 2020-11-30 VITALS — BP 132/80 | HR 55 | Ht 69.0 in | Wt 172.4 lb

## 2020-11-30 DIAGNOSIS — I10 Essential (primary) hypertension: Secondary | ICD-10-CM | POA: Diagnosis not present

## 2020-11-30 DIAGNOSIS — Z720 Tobacco use: Secondary | ICD-10-CM | POA: Diagnosis not present

## 2020-11-30 DIAGNOSIS — E785 Hyperlipidemia, unspecified: Secondary | ICD-10-CM | POA: Diagnosis not present

## 2020-11-30 DIAGNOSIS — I2510 Atherosclerotic heart disease of native coronary artery without angina pectoris: Secondary | ICD-10-CM | POA: Diagnosis not present

## 2020-11-30 NOTE — Progress Notes (Signed)
Cardiology Office Note:    Date:  11/30/2020   ID:  Francisco Nash, DOB 1955-04-14, MRN [id number]  PCP:  Assunta Found, MD  Cardiologist:  None  Electrophysiologist:  None   Referring MD: Assunta Found, MD   No chief complaint on file.    History of Present Illness:    Francisco Nash is a 65 y.o. male with a hx of hypertension who presents for follow-up for chest pain.  He was seen in the ED on 09/27/2020 with chest pain.  Troponins negative x2.  EKG without ischemic changes.  He reports that he has been having chest pain 2-3 times per week.  Describes as sharp stabbing pain on the left side of his chest.  Typically lasts for seconds and resolves.  No clear relationship with exertion.  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  Reports no relationship between chest pain and eating.  The day he went to the ED reports he was drinking coffee and noticed the pain on the left side of his chest.  Reports it resolved after seconds but then kept recurring every few minutes.  This happened 3-4 times, prompting him to go to the ED.  He denies any dyspnea, lightheadedness, syncope, lower extremity edema, palpitations, or leg pain with walking.  He has smoked for over 50 years up to 1 pack/day, but is currently smoking 0.25 pack/day.  Family history includes brother had CABG in 46s and he thinks another brother has CHF but he is unsure of the details.  Coronary CTA on 10/13/2020 showed calcium score 1155 (92nd percentile) with diffuse disease in LAD and dominant Lcx, CT FFR suggested hemodynamically significant lesion in mid to distal LAD (CT FFR 0.75).  Echocardiogram on 10/15/2020 showed LVEF 50 to 55%, normal RV function, no significant valvular disease.  LHC on 11/01/2020 showed ostial to mid LAD 30% stenosis, distal LAD 40%, proximal to mid RCA 70% (nondominant), mid LCx 40%.  Since last clinic visit, he reports that he is doing okay.  He has had a couple episodes of chest pain that last  for few seconds and resolved.  He denies any dyspnea, headedness, syncope, lower extremity edema, or palpitations.  He goes on walks for 1 mile, denies any exertional chest pain.  Continues to smoke, about 1 pack/week.  BP Readings from Last 3 Encounters:  11/30/20 132/80  11/01/20 120/85  10/28/20 110/70     Past Medical History:  Diagnosis Date   Hypertension     Past Surgical History:  Procedure Laterality Date   LEFT HEART CATH AND CORONARY ANGIOGRAPHY N/A 11/01/2020   Procedure: LEFT HEART CATH AND CORONARY ANGIOGRAPHY;  Surgeon: Marykay Lex, MD;  Location: [facility] INVASIVE CV LAB;  Service: Cardiovascular;  Laterality: N/A;    Current Medications: Current Meds  Medication Sig   aspirin EC 81 MG tablet Take 1 tablet (81 mg total) by mouth daily. Swallow whole.   nitroGLYCERIN (NITROSTAT) 0.4 MG SL tablet Place 1 tablet (0.4 mg total) under the tongue every 5 (five) minutes as needed.   olmesartan (BENICAR) 40 MG tablet Take 40 mg by mouth daily.   rosuvastatin (CRESTOR) 10 MG tablet Take 1 tablet (10 mg total) by mouth daily.   Current Facility-Administered Medications for the 11/30/20 encounter (Office Visit) with Little Ishikawa, MD  Medication   sodium chloride flush (NS) 0.9 % injection 3 mL     Allergies:   Patient has no known allergies.  Social History   Socioeconomic History   Marital status: Widowed    Spouse name: Not on file   Number of children: Not on file   Years of education: Not on file   Highest education level: Not on file  Occupational History   Not on file  Tobacco Use   Smoking status: Some Days    Packs/day: 0.50    Types: Cigarettes   Smokeless tobacco: Never  Substance and Sexual Activity   Alcohol use: No   Drug use: No   Sexual activity: Not on file  Other Topics Concern   Not on file  Social History Narrative   Not on file   Social Determinants of Health   Financial Resource Strain: Not on file  Food Insecurity: Not  on file  Transportation Needs: Not on file  Physical Activity: Not on file  Stress: Not on file  Social Connections: Not on file     Family History: Family history includes brother had CABG in 53s and he thinks another brother has CHF but he is unsure of the details.  ROS:   Please see the history of present illness.     (+)chest pains  All other systems reviewed and are negative.  EKGs/Labs/Other Studies Reviewed:    The following studies were reviewed today:  Echo 07/22: IMPRESSIONS    1. Endocardium is difficult in apical views. Short axis images show no  wall motion abnormalities . Left ventricular ejection fraction, by  estimation, is 50 to 55%. The left ventricle has low normal function. Left  ventricular diastolic parameters are  indeterminate.   2. Right ventricular systolic function is normal. The right ventricular  size is normal.   3. The mitral valve is normal in structure. Trivial mitral valve  regurgitation.   4. The aortic valve is tricuspid. Aortic valve regurgitation is not  visualized. Mild to moderate aortic valve sclerosis/calcification is  present, without any evidence of aortic stenosis.   5. The inferior vena cava is normal in size with greater than 50%  respiratory variability, suggesting right atrial pressure of 3 mmHg.   EKG:  07/22: sinus bradycardia, rate 50, no ST abnormalities 06/22:sinus bradycardia, rate 53, no ST abnormalities  Recent Labs: 10/28/2020: BUN 14; Creatinine, Ser 0.99; Hemoglobin 16.3; Platelets 222; Potassium 4.5; Sodium 143  Recent Lipid Panel    Component Value Date/Time   CHOL 207 (H) 09/30/2020 0905   TRIG 79 09/30/2020 0905   HDL 53 09/30/2020 0905   CHOLHDL 3.9 09/30/2020 0905   CHOLHDL 5.2 06/06/2007 0200   VLDL 20 06/06/2007 0200   LDLCALC 140 (H) 09/30/2020 0905    Physical Exam:    VS:  BP 132/80   Pulse (!) 55   Ht 5\' 9"  (1.753 m)   Wt 172 lb 6.4 oz (78.2 kg)   SpO2 98%   BMI 25.46 kg/m     Wt  Readings from Last 3 Encounters:  11/30/20 172 lb 6.4 oz (78.2 kg)  11/01/20 162 lb (73.5 kg)  10/28/20 167 lb 3.2 oz (75.8 kg)     GEN:  Well nourished, well developed in no acute distress HEENT: Normal NECK: No JVD; No carotid bruits CARDIAC:  distant heart sounds, RRR, no murmurs RESPIRATORY:  Clear to auscultation without rales, wheezing or rhonchi  ABDOMEN: Soft, non-tender, non-distended MUSCULOSKELETAL:  No edema; No deformity  SKIN: Warm and dry NEUROLOGIC:  Alert and oriented x 3 PSYCHIATRIC:  Normal affect   ASSESSMENT:    1.  Coronary artery disease involving native coronary artery of native heart without angina pectoris   2. Hyperlipidemia, unspecified hyperlipidemia type   3. Essential hypertension   4. Tobacco use      PLAN:    CAD: reported atypical chest pain.  Coronary CTA on 10/13/2020 showed calcium score 1155 (92nd percentile) with diffuse disease in LAD and dominant Lcx, CT FFR suggested hemodynamically significant lesion in mid to distal LAD (CT FFR 0.75).  Echocardiogram on 10/15/2020 showed LVEF 50 to 55%, normal RV function, no significant valvular disease.  LHC on 11/01/2020 showed ostial to mid LAD 30% stenosis, distal LAD 40%, proximal to mid RCA 70% (nondominant), mid LCx 40%.  Denies anginal symptoms. -Continue ASA 81 mg daily -Continue rosuvastatin 10 mg daily -PRN SL NTG  Hypertension: on olmesartan 40 mg daily.  Appears controlled  Hyperlipidemia: LDL 140 on 09/30/2020.  Started rosuvastatin 10 mg daily last month, will recheck fasting lipid panel in 1 month.  Goal LDL less than 70.  Tobacco use: Patient counseled on the risk of tobacco use and cessation strongly encouraged.  Offered to have care guide work with patient to help with cessation but he declines at this time.  RTC in 3 months   Medication Adjustments/Labs and Tests Ordered: Current medicines are reviewed at length with the patient today.  Concerns regarding medicines are outlined  above.  Orders Placed This Encounter  Procedures   Lipid panel      No orders of the defined types were placed in this encounter.     Patient Instructions  Medication Instructions:  Your physician recommends that you continue on your current medications as directed. Please refer to the Current Medication list given to you today.  *If you need a refill on your cardiac medications before your next appointment, please call your pharmacy*   Lab Work: Please return for FASTING labs in 1 month (Lipid)  Our in office lab hours are Monday-Friday 8:00-4:00, closed for lunch 12:45-1:45 pm.  No appointment needed.  Follow-Up: At [facility], you and your health needs are our priority.  As part of our continuing mission to provide you with exceptional heart care, we have created designated Provider Care Teams.  These Care Teams include your primary Cardiologist (physician) and Advanced Practice Providers (APPs -  Physician Assistants and Nurse Practitioners) who all work together to provide you with the care you need, when you need it.  We recommend signing up for the patient portal called "MyChart".  Sign up information is provided on this After Visit Summary.  MyChart is used to connect with patients for Virtual Visits (Telemedicine).  Patients are able to view lab/test results, encounter notes, upcoming appointments, etc.  Non-urgent messages can be sent to your provider as well.   To learn more about what you can do with MyChart, go to [email or web address].    Your next appointment:   6 month(s)  The format for your next appointment:   In Person  Provider:   Dr. Bjorn Pippin       Signed, Little Ishikawa, MD  11/30/2020 10:29 PM    [facility]

## 2020-11-30 NOTE — Patient Instructions (Signed)
Medication Instructions:  Your physician recommends that you continue on your current medications as directed. Please refer to the Current Medication list given to you today.  *If you need a refill on your cardiac medications before your next appointment, please call your pharmacy*   Lab Work: Please return for FASTING labs in 1 month (Lipid)  Our in office lab hours are Monday-Friday 8:00-4:00, closed for lunch 12:45-1:45 pm.  No appointment needed.  Follow-Up: At [facility], you and your health needs are our priority.  As part of our continuing mission to provide you with exceptional heart care, we have created designated Provider Care Teams.  These Care Teams include your primary Cardiologist (physician) and Advanced Practice Providers (APPs -  Physician Assistants and Nurse Practitioners) who all work together to provide you with the care you need, when you need it.  We recommend signing up for the patient portal called "MyChart".  Sign up information is provided on this After Visit Summary.  MyChart is used to connect with patients for Virtual Visits (Telemedicine).  Patients are able to view lab/test results, encounter notes, upcoming appointments, etc.  Non-urgent messages can be sent to your provider as well.   To learn more about what you can do with MyChart, go to [email or web address].    Your next appointment:   6 month(s)  The format for your next appointment:   In Person  Provider:   Dr. Bjorn Pippin

## 2021-01-14 LAB — LIPID PANEL
Chol/HDL Ratio: 2.1 ratio (ref 0.0–5.0)
Cholesterol, Total: 124 mg/dL (ref 100–199)
HDL: 58 mg/dL (ref >39)
LDL Chol Calc (NIH): 55 mg/dL (ref 0–99)
Triglycerides: 44 mg/dL (ref 0–149)
VLDL Cholesterol Cal: 11 mg/dL (ref 5–40)

## 2021-01-19 ENCOUNTER — Encounter: Payer: Self-pay | Admitting: *Deleted

## 2021-01-21 ENCOUNTER — Ambulatory Visit: Payer: Medicare Other | Admitting: Cardiology

## 2021-06-27 ENCOUNTER — Other Ambulatory Visit: Payer: Self-pay

## 2021-06-27 ENCOUNTER — Encounter (HOSPITAL_COMMUNITY): Payer: Self-pay

## 2021-06-27 ENCOUNTER — Emergency Department (HOSPITAL_COMMUNITY)
Admission: EM | Admit: 2021-06-27 | Discharge: 2021-06-27 | Disposition: A | Discharge status: Home / Self Care | Payer: Medicare Other | Attending: Emergency Medicine | Admitting: Emergency Medicine

## 2021-06-27 DIAGNOSIS — S61412A Laceration without foreign body of left hand, initial encounter: Secondary | ICD-10-CM

## 2021-06-27 DIAGNOSIS — W268XXA Contact with other sharp object(s), not elsewhere classified, initial encounter: Secondary | ICD-10-CM | POA: Diagnosis not present

## 2021-06-27 DIAGNOSIS — S61512A Laceration without foreign body of left wrist, initial encounter: Secondary | ICD-10-CM | POA: Insufficient documentation

## 2021-06-27 DIAGNOSIS — Z7982 Long term (current) use of aspirin: Secondary | ICD-10-CM | POA: Insufficient documentation

## 2021-06-27 DIAGNOSIS — Z23 Encounter for immunization: Secondary | ICD-10-CM | POA: Diagnosis not present

## 2021-06-27 DIAGNOSIS — S6992XA Unspecified injury of left wrist, hand and finger(s), initial encounter: Secondary | ICD-10-CM | POA: Diagnosis present

## 2021-06-27 MED ORDER — LIDOCAINE HCL (PF) 1 % IJ SOLN
5.0000 mL | Freq: Once | INTRAMUSCULAR | Status: AC
  Administered 2021-06-27: 5 mL via INTRADERMAL
  Filled 2021-06-27: qty 5

## 2021-06-27 MED ORDER — TETANUS-DIPHTH-ACELL PERTUSSIS 5-2.5-18.5 LF-MCG/0.5 IM SUSY
0.5000 mL | PREFILLED_SYRINGE | Freq: Once | INTRAMUSCULAR | Status: AC
  Administered 2021-06-27: 0.5 mL via INTRAMUSCULAR
  Filled 2021-06-27: qty 0.5

## 2021-06-27 NOTE — ED Provider Notes (Signed)
?[facility] ?Provider Note ? ? ?CSN: [id number] ?Arrival date & time: 06/27/21  1956 ? ?  ? ?History ? ?Chief Complaint  ?Patient presents with  ? Laceration  ? ? ?Francisco Nash is a 66 y.o. male. ? ?Pt fell and cut wrist on a sharp piece of metal  ? ?The history is provided by the patient. No language interpreter was used.  ?Laceration ?Length:  1.2 ?Depth:  Through dermis ?Quality: stellate   ?Bleeding: controlled   ?Time since incident:  3 hours ?Laceration mechanism:  Metal edge ?Foreign body present:  No foreign bodies ?Relieved by:  Nothing ?Tetanus status:  Unknown ? ?  ? ?Home Medications ?Prior to Admission medications   ?Medication Sig Start Date End Date Taking? Authorizing Provider  ?aspirin EC 81 MG tablet Take 1 tablet (81 mg total) by mouth daily. Swallow whole. 10/19/20   Little Ishikawa, MD  ?nitroGLYCERIN (NITROSTAT) 0.4 MG SL tablet Place 1 tablet (0.4 mg total) under the tongue every 5 (five) minutes as needed. 10/28/20 01/26/21  Little Ishikawa, MD  ?olmesartan (BENICAR) 40 MG tablet Take 40 mg by mouth daily.    [provider]  ?rosuvastatin (CRESTOR) 10 MG tablet Take 1 tablet (10 mg total) by mouth daily. 10/19/20 01/17/21  Little Ishikawa, MD  ?   ? ?Allergies    ?Patient has no known allergies.   ? ?Review of Systems   ?Review of Systems  ?All other systems reviewed and are negative. ? ?Physical Exam ?Updated Vital Signs ?BP (!) 148/96 (BP Location: Right Arm)   Pulse 83   Temp 98.1 ?F (36.7 ?C) (Oral)   Resp 18   Ht 5\' 9"  (1.753 m)   Wt 78.9 kg   SpO2 98%   BMI 25.69 kg/m?  ?Physical Exam ?Vitals reviewed.  ?Constitutional:   ?   Appearance: Normal appearance.  ?HENT:  ?   Head: Normocephalic.  ?Cardiovascular:  ?   Rate and Rhythm: Normal rate.  ?Pulmonary:  ?   Effort: Pulmonary effort is normal.  ?Musculoskeletal:     ?   General: No swelling. Normal range of motion.  ?   Comments: 1.3 cm laceration palmar aspect of left wrist    ?Skin: ?   General: Skin is warm.  ?Neurological:  ?   General: No focal deficit present.  ?   Mental Status: He is alert.  ?Psychiatric:     ?   Mood and Affect: Mood normal.  ? ? ?ED Results / Procedures / Treatments   ?Labs ?(all labs ordered are listed, but only abnormal results are displayed) ?Labs Reviewed - No data to display ? ?EKG ?None ? ?Radiology ?No results found. ? ?Procedures ? .Laceration Repair ? ?Date/Time: 06/27/2021 10:00 PM ?Performed by: 06/29/2021, PA-C ?Authorized by: Elson Areas, PA-C  ? ?Consent:  ?  Consent obtained:  Verbal ?  Consent given by:  Patient ?  Risks, benefits, and alternatives were discussed: yes   ?  Risks discussed:  Infection ?Universal protocol:  ?  Procedure explained and questions answered to patient or proxy's satisfaction: yes   ?  Immediately prior to procedure, a time out was called: no   ?  Patient identity confirmed:  Verbally with patient ?Anesthesia:  ?  Anesthesia method:  Local infiltration ?  Local anesthetic:  Lidocaine 1% w/o epi ?Laceration details:  ?  Location:  Hand ?  Hand location:  L wrist ?  Length (cm):  1.3 ?  Depth (mm):  2 ?Pre-procedure details:  ?  Preparation:  Patient was prepped and draped in usual sterile fashion ?Exploration:  ?  Wound exploration: wound explored through full range of motion and entire depth of wound visualized   ?  Wound extent: no nerve damage noted and no tendon damage noted   ?  Contaminated: no   ?Treatment:  ?  Area cleansed with:  Povidone-iodine ?  Irrigation solution:  Sterile saline ?  Debridement:  None ?  Undermining:  None ?Skin repair:  ?  Repair method:  Sutures ?  Suture size:  5-0 ?  Suture material:  Prolene ?  Suture technique:  Simple interrupted ?  Number of sutures:  3 ?Approximation:  ?  Approximation:  Loose ?Repair type:  ?  Repair type:  Intermediate ?Post-procedure details:  ?  Procedure completion:  Tolerated  ? ? ?Medications Ordered in ED ?Medications  ?lidocaine (PF) (XYLOCAINE) 1 %  injection 5 mL (has no administration in time range)  ?Tdap (BOOSTRIX) injection 0.5 mL (0.5 mLs Intramuscular Given 06/27/21 2124)  ? ? ?ED Course/ Medical Decision Making/ A&P ?  ?                        ?Medical Decision Making ?Risk ?Prescription drug management. ? ? ?MDM:  pt advised suture removal in 8 days  ? ? ? ? ? ? ? ?Final Clinical Impression(s) / ED Diagnoses ?Final diagnoses:  ?Laceration of left hand, foreign body presence unspecified, initial encounter  ? ? ?Rx / DC Orders ?ED Discharge Orders   ? ? None  ? ?  ? ?An After Visit Summary was printed and given to the patient.  ?  ?Elson Areas, PA-C ?06/27/21 2202 ? ?  ?Jacalyn Lefevre, MD ?06/28/21 1523 ? ?

## 2021-06-27 NOTE — ED Triage Notes (Signed)
Ambulatory to ED with c/o of lac to inner L wrist. Bandaged in triage. Tetanus not UTD.  ?

## 2021-06-27 NOTE — Discharge Instructions (Signed)
Suture removal in 8 days  °

## 2021-07-06 ENCOUNTER — Ambulatory Visit
Admission: EM | Admit: 2021-07-06 | Discharge: 2021-07-06 | Disposition: A | Discharge status: Home / Self Care | Payer: Medicare Other

## 2021-07-06 NOTE — ED Notes (Signed)
Removed 1 suture from patient's left wrist. ?

## 2021-07-06 NOTE — ED Triage Notes (Signed)
Pt states he has sutures in his left hand to be removed  ? ?Pt states he had them put in on the 20th of this month and 2 of the sutures came out on their own yesterday ? ?Pt states the area is a little red but no puss or pain ? ?Pt states he has been using neosporin ? ?

## 2021-11-25 ENCOUNTER — Other Ambulatory Visit: Payer: Self-pay | Admitting: Cardiology

## 2022-02-16 ENCOUNTER — Other Ambulatory Visit: Payer: Self-pay | Admitting: Cardiology

## 2022-03-23 ENCOUNTER — Other Ambulatory Visit: Payer: Self-pay | Admitting: Cardiology

## 2022-04-28 ENCOUNTER — Other Ambulatory Visit: Payer: Self-pay | Admitting: Cardiology

## 2023-06-10 ENCOUNTER — Other Ambulatory Visit: Payer: Self-pay

## 2023-06-10 ENCOUNTER — Emergency Department (HOSPITAL_COMMUNITY)

## 2023-06-10 ENCOUNTER — Encounter (HOSPITAL_COMMUNITY): Payer: Self-pay | Admitting: *Deleted

## 2023-06-10 ENCOUNTER — Emergency Department (HOSPITAL_COMMUNITY)
Admission: EM | Admit: 2023-06-10 | Discharge: 2023-06-10 | Disposition: A | Discharge status: Home / Self Care | Attending: Emergency Medicine | Admitting: Emergency Medicine

## 2023-06-10 DIAGNOSIS — Z7982 Long term (current) use of aspirin: Secondary | ICD-10-CM | POA: Insufficient documentation

## 2023-06-10 DIAGNOSIS — M25562 Pain in left knee: Secondary | ICD-10-CM | POA: Insufficient documentation

## 2023-06-10 NOTE — ED Triage Notes (Signed)
 Pt with left knee pain x 3 days, denies any injury. Pt states swelling  and mild redness. Sharp pain with walking on it.

## 2023-06-10 NOTE — Discharge Instructions (Signed)
 Your x-rays are negative for any obvious injury today, I suspect you may have a knee strain or possible ligament or tendon irritation.  I do recommend arthritis strength Tylenol as we discussed.  Use your crutches to help minimize weightbearing, but use caution with them.  Plan follow-up with Dr. Wyline Mood if your symptoms are not improving with this treatment plan.  You may also benefit from ice packs applied to the site is much as is comfortable.

## 2023-06-14 NOTE — ED Provider Notes (Signed)
 [facility] DEPART[facility] AT [facility] Provider Note   CSN: [id number] Arrival date & time: 06/10/23  1121     History  Chief Complaint  Patient presents with   Knee Pain    Francisco Nash is a 68 y.o. male presenting with a 3 day history of left knee pain. He denies injury or falls, no specific overuse.  Pain is localized to his lateral knee and has noted redness and swelling which is currently improved. Pain is worsened with weight bearing, better at rest. He has found no alleviators. He has been using a borrowed walker but would rather have crutches.  The history is provided by the patient.       Home Medications Prior to Admission medications   Medication Sig Start Date End Date Taking? Authorizing Provider  aspirin EC 81 MG tablet Take 1 tablet (81 mg total) by mouth daily. Swallow whole. 10/19/20   Little Ishikawa, MD  nitroGLYCERIN (NITROSTAT) 0.4 MG SL tablet Place 1 tablet (0.4 mg total) under the tongue every 5 (five) minutes as needed. 10/28/20 01/26/21  Little Ishikawa, MD  olmesartan (BENICAR) 40 MG tablet Take 40 mg by mouth daily.    [provider]  rosuvastatin (CRESTOR) 10 MG tablet TAKE 1 TABLET BY MOUTH ONCE DAILY . APPOINTMENT REQUIRED FOR FUTURE REFILLS 03/23/22   Little Ishikawa, MD      Allergies    Patient has no known allergies.    Review of Systems   Review of Systems  Constitutional:  Negative for fever.  Musculoskeletal:  Positive for arthralgias and joint swelling. Negative for myalgias.  Neurological:  Negative for weakness and numbness.  All other systems reviewed and are negative.   Physical Exam Updated Vital Signs BP 122/81   Pulse 70   Temp 98 F (36.7 C) (Temporal)   Resp 16   Ht 5\' 11"  (1.803 m)   Wt 77.1 kg   SpO2 97%   BMI 23.71 kg/m  Physical Exam Constitutional:      Appearance: He is well-developed.  HENT:     Head: Atraumatic.  Cardiovascular:     Comments: Pulses  equal bilaterally Musculoskeletal:        General: Tenderness present.     Cervical back: Normal range of motion.     Right knee: Normal.     Left knee: Bony tenderness present. No swelling, deformity, effusion, erythema or crepitus. No LCL laxity, MCL laxity, ACL laxity or PCL laxity.    Comments: TTP left lateral fibular head.  No palpable deformity.  Pain radiates to mid tibia with palpation. Active knee flexion fairly normal without sig pain.   Skin:    General: Skin is warm and dry.  Neurological:     Mental Status: He is alert.     Sensory: No sensory deficit.     Motor: No weakness.     Deep Tendon Reflexes: Reflexes normal.     ED Results / Procedures / Treatments   Labs (all labs ordered are listed, but only abnormal results are displayed) Labs Reviewed - No data to display  EKG None  Radiology No results found. Results for orders placed or performed in visit on 11/30/20  Lipid panel   Collection Time: 01/13/21  8:33 AM  Result Value Ref Range   Cholesterol, Total 124 100 - 199 mg/dL   Triglycerides 44 0 - 149 mg/dL   HDL 58 >40 mg/dL   VLDL Cholesterol Cal 11 5 -  40 mg/dL   LDL Chol Calc (NIH) 55 0 - 99 mg/dL   Chol/HDL Ratio 2.1 0.0 - 5.0 ratio   DG Tibia/Fibula Left Result Date: 06/10/2023 CLINICAL DATA:  Severe pain localizing 2 proximal fibula for 3 days, swelling, erythema EXAM: LEFT TIBIA AND FIBULA - 2 VIEW COMPARISON:  06/10/2023 FINDINGS: Frontal and lateral views of the left tibia and fibula are obtained. No acute or destructive bony abnormalities. Alignment is anatomic. Mild osteoarthritis medial compartment left knee. Soft tissues are unremarkable. IMPRESSION: 1. Unremarkable left tibia and fibula. Electronically Signed   By: Sharlet Salina M.D.   On: 06/10/2023 12:51   DG Knee Complete 4 Views Left Result Date: 06/10/2023 CLINICAL DATA:  Left knee pain for 3 days, swelling and mild erythema EXAM: LEFT KNEE - COMPLETE 4+ VIEW COMPARISON:  None Available.  FINDINGS: Frontal, bilateral oblique, lateral views of the left knee are obtained. No fracture, subluxation, or dislocation. There is mild medial compartmental joint space narrowing and minimal patellar spurring, consistent with osteoarthritis. No joint effusion. Soft tissues are unremarkable. IMPRESSION: 1. Mild medial and patellofemoral compartmental osteoarthritis. 2. No acute bony abnormality. Electronically Signed   By: Sharlet Salina M.D.   On: 06/10/2023 12:08    Procedures Procedures    Medications Ordered in ED Medications - No data to display  ED Course/ Medical Decision Making/ A&P                                 Medical Decision Making Pain left knee, localizing to proximal fibular head.  Imaging reassuring with no fx or dislocation. Discussed home care, roll of ice, heat, AAT,  crutches provided patients preference.  Ortho Dr. Thurston Hole, prn f/u for further eval/management.  Suspect soft tissue/ligament vs bursal pain.  No edema or erythema, no signs of septic joint.    Amount and/or Complexity of Data Reviewed Radiology: ordered.    Details: Reviewed,  agree with mild djd,  no fx or dislocation, no effusion.           Final Clinical Impression(s) / ED Diagnoses Final diagnoses:  Acute pain of left knee    Rx / DC Orders ED Discharge Orders     None         Victoriano Lain 06/14/23 1707    Bethann Berkshire, MD 06/17/23 [id number]

## 2023-08-23 ENCOUNTER — Ambulatory Visit
Admission: RE | Admit: 2023-08-23 | Discharge: 2023-08-23 | Disposition: A | Discharge status: Home / Self Care | Payer: Worker's Compensation | Source: Ambulatory Visit | Attending: Nurse Practitioner | Admitting: Nurse Practitioner

## 2023-08-23 ENCOUNTER — Ambulatory Visit (INDEPENDENT_AMBULATORY_CARE_PROVIDER_SITE_OTHER)

## 2023-08-23 VITALS — BP 132/74 | HR 64 | Temp 97.1°F | Resp 16

## 2023-08-23 DIAGNOSIS — S67190A Crushing injury of right index finger, initial encounter: Secondary | ICD-10-CM

## 2023-08-23 DIAGNOSIS — S60021A Contusion of right index finger without damage to nail, initial encounter: Secondary | ICD-10-CM

## 2023-08-23 NOTE — Discharge Instructions (Addendum)
 The x-ray of your right index finger is negative for fracture or dislocation. You may take over-the-counter Tylenol as needed for pain, fever, or general discomfort. Apply ice to the right index finger to help with pain or swelling.  Apply for 20 minutes, remove for 1 hour, repeat as much as possible. Gentle range of motion exercises of the right index finger to help with symptoms. As discussed, you will need to follow-up with occupational health for reevaluation on 08/24/23.  I have provided the [place] and [place] locations for you.  You can follow-up at either location. Follow-up as needed.

## 2023-08-23 NOTE — ED Provider Notes (Signed)
 [facility]    CSN: [id number] Arrival date & time: 08/23/23  1149      History   Chief Complaint Chief Complaint  Patient presents with   Finger Injury    Right HandWorkers Comp - Entered by patient    HPI Francisco Nash is a 68 y.o. male.   The history is provided by the patient.   Patient presents for pain and swelling to the tip of the right index finger.  Patient reports he was at work and they were trying to remove a tank that was filled with water from under a house.  States "the tank got away from us ", ting landed on the tip of his right index finger.  Patient states there is a "knot" on the fingertip and states that he has pain when he is bending the finger at the tip.  He denies numbness, tingling, radiation of pain, or bruising.  Patient states he is right-hand dominant.  He has not taken any medication for his symptoms.  Past Medical History:  Diagnosis Date   Hypertension     Patient Active Problem List   Diagnosis Date Noted   Abnormal cardiac CT angiography 11/01/2020   Atypical angina (HCC) 11/01/2020   Chest pain of uncertain etiology     Past Surgical History:  Procedure Laterality Date   LEFT HEART CATH AND CORONARY ANGIOGRAPHY N/A 11/01/2020   Procedure: LEFT HEART CATH AND CORONARY ANGIOGRAPHY;  Surgeon: Arleen Lacer, MD;  Location: [facility] [place] [facility] CV LAB;  Service: Cardiovascular;  Laterality: N/A;       Home Medications    Prior to Admission medications   Medication Sig Start Date End Date Taking? Authorizing Provider  aspirin  EC 81 MG tablet Take 1 tablet (81 mg total) by mouth daily. Swallow whole. 10/19/20   Wendie Hamburg, MD  nitroGLYCERIN  (NITROSTAT ) 0.4 MG SL tablet Place 1 tablet (0.4 mg total) under the tongue every 5 (five) minutes as needed. 10/28/20 01/26/21  Wendie Hamburg, MD  olmesartan (BENICAR) 40 MG tablet Take 40 mg by mouth daily.    [provider]  rosuvastatin  (CRESTOR ) 10 MG tablet  TAKE 1 TABLET BY MOUTH ONCE DAILY . APPOINTMENT REQUIRED FOR FUTURE REFILLS 03/23/22   Wendie Hamburg, MD    Family History History reviewed. No pertinent family history.  Social History Social History   Tobacco Use   Smoking status: Some Days    Current packs/day: 0.50    Types: Cigarettes   Smokeless tobacco: Never  Vaping Use   Vaping status: Never Used  Substance Use Topics   Alcohol use: No   Drug use: No     Allergies   Patient has no known allergies.   Review of Systems Review of Systems Per HPI  Physical Exam Triage Vital Signs ED Triage Vitals  Encounter Vitals Group     BP 08/23/23 1206 132/74     Systolic BP Percentile --      Diastolic BP Percentile --      Pulse Rate 08/23/23 1206 64     Resp 08/23/23 1206 16     Temp 08/23/23 1206 (!) 97.1 F (36.2 C)     Temp Source 08/23/23 1206 Oral     SpO2 08/23/23 1206 97 %     Weight --      Height --      Head Circumference --      Peak Flow --      Pain Score  08/23/23 1210 4     Pain Loc --      Pain Education --      Exclude from Growth Chart --    No data found.  Updated Vital Signs BP 132/74 (BP Location: Right Arm)   Pulse 64   Temp (!) 97.1 F (36.2 C) (Oral)   Resp 16   SpO2 97%   Visual Acuity Right Eye Distance:   Left Eye Distance:   Bilateral Distance:    Right Eye Near:   Left Eye Near:    Bilateral Near:     Physical Exam Vitals and nursing note reviewed.  Constitutional:      General: He is not in acute distress.    Appearance: Normal appearance.  Eyes:     Extraocular Movements: Extraocular movements intact.     Pupils: Pupils are equal, round, and reactive to light.  Pulmonary:     Effort: Pulmonary effort is normal.  Musculoskeletal:     Right hand: Swelling (distal phalanx of left index finger. Indurations noted to the medial aspect of the finger at the cuticle edge and at the cuticle) and tenderness (distal phalanx at the extensor tendon) present. No  deformity. Normal range of motion. Normal strength. Normal sensation. Normal capillary refill. Normal pulse.     Cervical back: Normal range of motion.  Skin:    General: Skin is warm and dry.  Neurological:     Mental Status: He is alert and oriented to person, place, and time.  Psychiatric:        Mood and Affect: Mood normal.        Behavior: Behavior normal.      UC Treatments / Results  Labs (all labs ordered are listed, but only abnormal results are displayed) Labs Reviewed - No data to display  EKG   Radiology DG Finger Index Right Result Date: 08/23/2023 CLINICAL DATA:  Pain after injury EXAM: RIGHT INDEX FINGER 3V COMPARISON:  None Available. FINDINGS: No fracture or dislocation. Preserved bone mineralization. Minimal osteophytes along the distal interphalangeal joint. There is some joint space loss of the second metacarpal phalangeal joint. IMPRESSION: Mild degenerative changes. Electronically Signed   By: Adrianna Horde M.D.   On: 08/23/2023 12:39    Procedures Procedures (including critical care time)  Medications Ordered in UC Medications - No data to display  Initial Impression / Assessment and Plan / UC Course  I have reviewed the triage vital signs and the nursing notes.  Pertinent labs & imaging results that were available during my care of the patient were reviewed by me and considered in my medical decision making (see chart for details).  The x-ray was negative for fracture or dislocation, x-ray does show mild degenerative changes.  Symptoms consistent with a contusion of the right index finger based on the mechanism of injury.  Based on the exam, restrictions are not necessary at this time.  Supportive care recommendations were provided and discussed with the patient to include over-the-counter analgesics, and RICE therapy.  Patient was given information to follow-up with occupational health on 08/24/2023.  Patient was given information for the [place] and  [place] locations.  Patient was in agreement with this plan of care and verbalizes understanding.  All questions were answered.  Patient stable for discharge.  Final Clinical Impressions(s) / UC Diagnoses   Final diagnoses:  Crushing injury of right index finger, initial encounter  Contusion of right index finger without damage to nail, initial encounter  Discharge Instructions      The x-ray of your right index finger is negative for fracture or dislocation. You may take over-the-counter Tylenol as needed for pain, fever, or general discomfort. Apply ice to the right index finger to help with pain or swelling.  Apply for 20 minutes, remove for 1 hour, repeat as much as possible. Gentle range of motion exercises of the right index finger to help with symptoms. As discussed, you will need to follow-up with occupational health for reevaluation on 08/24/23.  I have provided the [place] and [place] locations for you.  You can follow-up at either location. Follow-up as needed.    ED Prescriptions   None    PDMP not reviewed this encounter.   Hardy Lia, NP 08/23/23 1256

## 2023-08-23 NOTE — ED Triage Notes (Signed)
 Pt reports right pointer finger, was getting a tank from under a house the tank broke at the top and caught his finger. It has caused swelling. Pain when bending the finger, at the tip of the finger. Occurred 1 hr ago. Workers comp.
